# Patient Record
Sex: Male | Born: 1960 | Race: White | Hispanic: No | Marital: Married | Smoking: Former smoker
Health system: Southern US, Community
[De-identification: ages and names within clinical notes are randomized; demographics above are authoritative.]

## PROBLEM LIST (undated history)

## (undated) DIAGNOSIS — E785 Hyperlipidemia, unspecified: Secondary | ICD-10-CM

## (undated) DIAGNOSIS — I252 Old myocardial infarction: Secondary | ICD-10-CM

## (undated) DIAGNOSIS — I2510 Atherosclerotic heart disease of native coronary artery without angina pectoris: Secondary | ICD-10-CM

## (undated) DIAGNOSIS — I219 Acute myocardial infarction, unspecified: Secondary | ICD-10-CM

## (undated) DIAGNOSIS — M1990 Unspecified osteoarthritis, unspecified site: Secondary | ICD-10-CM

## (undated) DIAGNOSIS — I10 Essential (primary) hypertension: Secondary | ICD-10-CM

## (undated) HISTORY — DX: Old myocardial infarction: I25.2

## (undated) HISTORY — PX: SHOULDER ARTHROSCOPY: SHX128

## (undated) HISTORY — DX: Acute myocardial infarction, unspecified: I21.9

## (undated) HISTORY — PX: OTHER SURGICAL HISTORY: SHX169

## (undated) HISTORY — DX: Hyperlipidemia, unspecified: E78.5

## (undated) HISTORY — PX: ELBOW SURGERY: SHX618

## (undated) HISTORY — DX: Essential (primary) hypertension: I10

---

## 1999-12-24 ENCOUNTER — Emergency Department (HOSPITAL_COMMUNITY): Admission: EM | Admit: 1999-12-24 | Discharge: 1999-12-24 | Payer: Self-pay | Admitting: Emergency Medicine

## 2000-05-21 ENCOUNTER — Encounter: Admission: RE | Admit: 2000-05-21 | Discharge: 2000-05-21 | Payer: Self-pay | Admitting: Occupational Medicine

## 2000-05-21 ENCOUNTER — Encounter: Payer: Self-pay | Admitting: Occupational Medicine

## 2000-06-03 ENCOUNTER — Encounter: Payer: Self-pay | Admitting: Occupational Medicine

## 2000-06-03 ENCOUNTER — Encounter: Admission: RE | Admit: 2000-06-03 | Discharge: 2000-06-03 | Payer: Self-pay | Admitting: Occupational Medicine

## 2002-06-24 ENCOUNTER — Encounter: Payer: Self-pay | Admitting: Family Medicine

## 2002-06-24 ENCOUNTER — Encounter: Admission: RE | Admit: 2002-06-24 | Discharge: 2002-06-24 | Payer: Self-pay | Admitting: Family Medicine

## 2002-09-20 ENCOUNTER — Ambulatory Visit (HOSPITAL_BASED_OUTPATIENT_CLINIC_OR_DEPARTMENT_OTHER): Admission: RE | Admit: 2002-09-20 | Discharge: 2002-09-20 | Payer: Self-pay | Admitting: Orthopedic Surgery

## 2002-09-20 ENCOUNTER — Encounter (INDEPENDENT_AMBULATORY_CARE_PROVIDER_SITE_OTHER): Payer: Self-pay | Admitting: Specialist

## 2003-08-16 ENCOUNTER — Emergency Department (HOSPITAL_COMMUNITY): Admission: EM | Admit: 2003-08-16 | Discharge: 2003-08-16 | Payer: Self-pay | Admitting: Emergency Medicine

## 2003-12-20 ENCOUNTER — Ambulatory Visit (HOSPITAL_COMMUNITY): Admission: RE | Admit: 2003-12-20 | Discharge: 2003-12-20 | Payer: Self-pay | Admitting: Unknown Physician Specialty

## 2003-12-28 ENCOUNTER — Ambulatory Visit (HOSPITAL_BASED_OUTPATIENT_CLINIC_OR_DEPARTMENT_OTHER): Admission: RE | Admit: 2003-12-28 | Discharge: 2003-12-28 | Payer: Self-pay | Admitting: Orthopedic Surgery

## 2003-12-28 ENCOUNTER — Ambulatory Visit (HOSPITAL_COMMUNITY): Admission: RE | Admit: 2003-12-28 | Discharge: 2003-12-28 | Payer: Self-pay | Admitting: Orthopedic Surgery

## 2004-01-10 ENCOUNTER — Encounter (HOSPITAL_COMMUNITY): Admission: RE | Admit: 2004-01-10 | Discharge: 2004-02-09 | Payer: Self-pay | Admitting: Orthopedic Surgery

## 2004-02-12 ENCOUNTER — Encounter (HOSPITAL_COMMUNITY): Admission: RE | Admit: 2004-02-12 | Discharge: 2004-03-13 | Payer: Self-pay | Admitting: Orthopedic Surgery

## 2004-03-15 ENCOUNTER — Encounter (HOSPITAL_COMMUNITY): Admission: RE | Admit: 2004-03-15 | Discharge: 2004-04-14 | Payer: Self-pay | Admitting: Orthopedic Surgery

## 2004-04-23 ENCOUNTER — Encounter (HOSPITAL_COMMUNITY): Admission: RE | Admit: 2004-04-23 | Discharge: 2004-05-23 | Payer: Self-pay | Admitting: Orthopedic Surgery

## 2005-01-31 ENCOUNTER — Ambulatory Visit (HOSPITAL_COMMUNITY): Admission: RE | Admit: 2005-01-31 | Discharge: 2005-01-31 | Payer: Self-pay | Admitting: *Deleted

## 2006-04-19 ENCOUNTER — Inpatient Hospital Stay (HOSPITAL_COMMUNITY): Admission: EM | Admit: 2006-04-19 | Discharge: 2006-04-22 | Payer: Self-pay | Admitting: Emergency Medicine

## 2006-04-19 ENCOUNTER — Ambulatory Visit: Payer: Self-pay | Admitting: *Deleted

## 2006-04-30 ENCOUNTER — Encounter: Payer: Self-pay | Admitting: Cardiology

## 2006-04-30 ENCOUNTER — Ambulatory Visit: Payer: Self-pay

## 2006-04-30 ENCOUNTER — Ambulatory Visit: Payer: Self-pay | Admitting: Cardiology

## 2006-05-07 ENCOUNTER — Encounter (HOSPITAL_COMMUNITY): Admission: RE | Admit: 2006-05-07 | Discharge: 2006-08-05 | Payer: Self-pay | Admitting: Cardiovascular Disease

## 2006-05-26 ENCOUNTER — Ambulatory Visit: Payer: Self-pay | Admitting: Cardiology

## 2006-07-01 ENCOUNTER — Ambulatory Visit: Payer: Self-pay | Admitting: Cardiology

## 2006-08-06 ENCOUNTER — Encounter (HOSPITAL_COMMUNITY): Admission: RE | Admit: 2006-08-06 | Discharge: 2006-09-04 | Payer: Self-pay | Admitting: Cardiovascular Disease

## 2006-08-19 ENCOUNTER — Ambulatory Visit: Payer: Self-pay | Admitting: Cardiology

## 2006-08-26 ENCOUNTER — Ambulatory Visit: Payer: Self-pay | Admitting: Cardiology

## 2006-10-23 ENCOUNTER — Ambulatory Visit: Payer: Self-pay | Admitting: Cardiology

## 2006-10-23 LAB — CONVERTED CEMR LAB: ALT: 35 units/L (ref 0–40)

## 2006-10-27 ENCOUNTER — Ambulatory Visit: Payer: Self-pay | Admitting: Cardiology

## 2006-11-11 ENCOUNTER — Ambulatory Visit: Payer: Self-pay

## 2007-01-18 ENCOUNTER — Ambulatory Visit: Payer: Self-pay | Admitting: Cardiology

## 2007-01-18 ENCOUNTER — Ambulatory Visit: Payer: Self-pay

## 2007-01-18 LAB — CONVERTED CEMR LAB
Alkaline Phosphatase: 62 units/L (ref 39–117)
HDL: 32.1 mg/dL — ABNORMAL LOW (ref >39.0)
LDL Cholesterol: 70 mg/dL (ref 0–99)
Total Bilirubin: 0.7 mg/dL (ref 0.3–1.2)
Total Protein: 6.7 g/dL (ref 6.0–8.3)

## 2007-03-30 HISTORY — PX: CARDIAC CATHETERIZATION: SHX172

## 2007-04-06 ENCOUNTER — Ambulatory Visit: Payer: Self-pay | Admitting: Cardiology

## 2007-05-12 ENCOUNTER — Ambulatory Visit: Payer: Self-pay | Admitting: Cardiology

## 2007-05-12 ENCOUNTER — Inpatient Hospital Stay (HOSPITAL_COMMUNITY): Admission: EM | Admit: 2007-05-12 | Discharge: 2007-05-13 | Payer: Self-pay | Admitting: *Deleted

## 2007-06-10 ENCOUNTER — Ambulatory Visit: Payer: Self-pay | Admitting: Cardiology

## 2007-09-06 ENCOUNTER — Ambulatory Visit: Payer: Self-pay | Admitting: Cardiology

## 2007-09-30 HISTORY — PX: NECK SURGERY: SHX720

## 2008-04-03 ENCOUNTER — Ambulatory Visit: Payer: Self-pay | Admitting: Cardiology

## 2008-04-13 ENCOUNTER — Ambulatory Visit: Payer: Self-pay | Admitting: Cardiology

## 2008-04-13 LAB — CONVERTED CEMR LAB
ALT: 47 units/L (ref 0–53)
AST: 34 units/L (ref 0–37)
Albumin: 3.8 g/dL (ref 3.5–5.2)
Alkaline Phosphatase: 70 units/L (ref 39–117)
Bilirubin, Direct: 0.1 mg/dL (ref 0.0–0.3)
HDL: 27.8 mg/dL — ABNORMAL LOW (ref >39.0)
LDL Cholesterol: 79 mg/dL (ref 0–99)
Total CHOL/HDL Ratio: 4.8
Triglycerides: 135 mg/dL (ref 0–149)

## 2008-08-11 ENCOUNTER — Emergency Department (HOSPITAL_COMMUNITY): Admission: EM | Admit: 2008-08-11 | Discharge: 2008-08-11 | Payer: Self-pay | Admitting: Emergency Medicine

## 2009-01-19 DIAGNOSIS — I2510 Atherosclerotic heart disease of native coronary artery without angina pectoris: Secondary | ICD-10-CM

## 2009-01-19 DIAGNOSIS — Z8679 Personal history of other diseases of the circulatory system: Secondary | ICD-10-CM | POA: Insufficient documentation

## 2009-01-19 DIAGNOSIS — E7800 Pure hypercholesterolemia, unspecified: Secondary | ICD-10-CM

## 2009-01-24 ENCOUNTER — Encounter: Payer: Self-pay | Admitting: Cardiology

## 2009-02-06 ENCOUNTER — Ambulatory Visit: Payer: Self-pay | Admitting: Cardiology

## 2009-02-06 DIAGNOSIS — F17200 Nicotine dependence, unspecified, uncomplicated: Secondary | ICD-10-CM

## 2009-02-27 ENCOUNTER — Telehealth: Payer: Self-pay | Admitting: Cardiology

## 2009-03-01 ENCOUNTER — Ambulatory Visit (HOSPITAL_COMMUNITY): Admission: RE | Admit: 2009-03-01 | Discharge: 2009-03-02 | Payer: Self-pay | Admitting: Neurosurgery

## 2009-03-22 ENCOUNTER — Encounter: Payer: Self-pay | Admitting: Cardiology

## 2009-04-17 ENCOUNTER — Ambulatory Visit: Payer: Self-pay | Admitting: Cardiology

## 2009-04-18 ENCOUNTER — Telehealth: Payer: Self-pay | Admitting: Cardiology

## 2009-04-26 LAB — CONVERTED CEMR LAB
Albumin: 4.1 g/dL (ref 3.5–5.2)
Alkaline Phosphatase: 67 units/L (ref 39–117)
Bilirubin, Direct: 0 mg/dL (ref 0.0–0.3)
HDL: 36.5 mg/dL — ABNORMAL LOW (ref >39.00)
LDL Cholesterol: 73 mg/dL (ref 0–99)
Total Bilirubin: 1.1 mg/dL (ref 0.3–1.2)
Total CHOL/HDL Ratio: 4
Total Protein: 6.5 g/dL (ref 6.0–8.3)

## 2009-04-27 ENCOUNTER — Ambulatory Visit: Payer: Self-pay | Admitting: Cardiology

## 2009-04-27 ENCOUNTER — Telehealth: Payer: Self-pay | Admitting: Cardiology

## 2009-05-01 LAB — CONVERTED CEMR LAB
Albumin: 4.1 g/dL (ref 3.5–5.2)
Alkaline Phosphatase: 62 units/L (ref 39–117)
Bilirubin, Direct: 0.2 mg/dL (ref 0.0–0.3)
Total Bilirubin: 1 mg/dL (ref 0.3–1.2)
Total Protein: 6.6 g/dL (ref 6.0–8.3)

## 2009-05-07 ENCOUNTER — Telehealth: Payer: Self-pay | Admitting: Cardiology

## 2009-05-10 ENCOUNTER — Ambulatory Visit: Payer: Self-pay | Admitting: Cardiology

## 2009-05-15 LAB — CONVERTED CEMR LAB
AST: 21 units/L (ref 0–37)
Alkaline Phosphatase: 67 units/L (ref 39–117)
Total Bilirubin: 0.9 mg/dL (ref 0.3–1.2)

## 2009-05-22 ENCOUNTER — Encounter: Payer: Self-pay | Admitting: Cardiology

## 2009-05-24 ENCOUNTER — Encounter: Payer: Self-pay | Admitting: Cardiology

## 2009-06-11 ENCOUNTER — Ambulatory Visit: Payer: Self-pay | Admitting: Cardiology

## 2009-08-02 ENCOUNTER — Telehealth (INDEPENDENT_AMBULATORY_CARE_PROVIDER_SITE_OTHER): Payer: Self-pay | Admitting: *Deleted

## 2009-08-14 ENCOUNTER — Encounter: Payer: Self-pay | Admitting: Cardiology

## 2009-09-10 ENCOUNTER — Ambulatory Visit: Payer: Self-pay | Admitting: Cardiology

## 2009-09-10 LAB — CONVERTED CEMR LAB
Bilirubin, Direct: 0 mg/dL (ref 0.0–0.3)
Direct LDL: 141.1 mg/dL
Total Bilirubin: 0.9 mg/dL (ref 0.3–1.2)
Total Protein: 7.1 g/dL (ref 6.0–8.3)

## 2009-09-11 ENCOUNTER — Telehealth: Payer: Self-pay | Admitting: Cardiology

## 2009-10-30 ENCOUNTER — Ambulatory Visit: Payer: Self-pay | Admitting: Cardiology

## 2009-10-31 ENCOUNTER — Telehealth: Payer: Self-pay | Admitting: Cardiology

## 2009-11-12 LAB — CONVERTED CEMR LAB
ALT: 26 units/L (ref 0–53)
Albumin: 4.3 g/dL (ref 3.5–5.2)
Alkaline Phosphatase: 58 units/L (ref 39–117)
Bilirubin, Direct: 0.1 mg/dL (ref 0.0–0.3)
HDL: 39.6 mg/dL (ref >39.00)
VLDL: 18.8 mg/dL (ref 0.0–40.0)

## 2009-12-10 ENCOUNTER — Ambulatory Visit: Payer: Self-pay | Admitting: Cardiology

## 2009-12-13 ENCOUNTER — Telehealth (INDEPENDENT_AMBULATORY_CARE_PROVIDER_SITE_OTHER): Payer: Self-pay | Admitting: *Deleted

## 2010-01-01 ENCOUNTER — Encounter: Payer: Self-pay | Admitting: Cardiology

## 2010-03-22 ENCOUNTER — Encounter: Payer: Self-pay | Admitting: Cardiology

## 2010-04-03 ENCOUNTER — Ambulatory Visit: Payer: Self-pay | Admitting: Cardiology

## 2010-04-08 ENCOUNTER — Encounter: Admission: RE | Admit: 2010-04-08 | Discharge: 2010-06-27 | Payer: Self-pay | Admitting: Orthopedic Surgery

## 2010-04-08 LAB — CONVERTED CEMR LAB
ALT: 29 units/L (ref 0–53)
AST: 24 units/L (ref 0–37)
Alkaline Phosphatase: 62 units/L (ref 39–117)
Bilirubin, Direct: 0.1 mg/dL (ref 0.0–0.3)
LDL Cholesterol: 73 mg/dL (ref 0–99)
Total Bilirubin: 0.6 mg/dL (ref 0.3–1.2)
VLDL: 31.8 mg/dL (ref 0.0–40.0)

## 2010-06-10 ENCOUNTER — Ambulatory Visit: Payer: Self-pay | Admitting: Cardiology

## 2010-07-01 ENCOUNTER — Encounter: Payer: Self-pay | Admitting: Cardiology

## 2010-10-31 NOTE — Letter (Signed)
Summary: [facility] Office Note  [facility] Office Note   Imported By: Roderic Ovens 11/23/2009 15:12:39  _____________________________________________________________________  External Attachment:    Type:   Image     Comment:   External Document

## 2010-10-31 NOTE — Miscellaneous (Signed)
Summary: Orders Update  Clinical Lists Changes  Orders: Added new Test order of TLB-Lipid Panel (80061-LIPID) - Signed Added new Test order of TLB-Hepatic/Liver Function Pnl (80076-HEPATIC) - Signed 

## 2010-10-31 NOTE — Progress Notes (Signed)
Summary: [facility]  Phone Note Refill Request Call back at Home Phone [phone] Message from:  Patient on December 13, 2009 9:55 AM  Refills Requested: Medication #1:  TOPROL XL 25 MG XR24H-TAB Take 1 tablet by mouth once a day  Medication #2:  CRESTOR 10 MG TABS Take one tablet by mouth daily. [facility] [id number].    Method Requested: Fax to Mail Away Pharmacy Initial call taken by: Lorne Skeens,  December 13, 2009 9:55 AM  Follow-up for Phone Call        Rx faxed to pharmacy Follow-up by: Vikki Ports,  December 13, 2009 1:42 PM    Prescriptions: CRESTOR 10 MG TABS (ROSUVASTATIN CALCIUM) Take one tablet by mouth daily.  #90 x 3   Entered by:   Vikki Ports   Authorized by:   Ronaldo Miyamoto, MD, [facility]   Signed by:   Vikki Ports on 12/13/2009   Method used:   Faxed to ...       [facility] Fax) (mail-order)             ,          Ph: [phone]       Fax: [phone]   RxID:   [id number] TOPROL XL 25 MG XR24H-TAB (METOPROLOL SUCCINATE) Take 1 tablet by mouth once a day  #90 x 3   Entered by:   Vikki Ports   Authorized by:   Ronaldo Miyamoto, MD, [facility]   Signed by:   Vikki Ports on 12/13/2009   Method used:   Faxed to ...       [facility] Delivery Fax) (mail-order)             ,          Ph: [phone]       Fax: [phone]   RxID:   [id number]

## 2010-10-31 NOTE — Progress Notes (Signed)
Summary: test results  Phone Note Call from Patient Call back at Home Phone [phone]   Caller: Patient Reason for Call: Talk to Nurse, Lab or Test Results Initial call taken by: Lorne Skeens,  October 31, 2009 4:19 PM  Follow-up for Phone Call        Pt. calling regarding lipid and liver results.  Pt. given results and I told him that Dr. Riley Kill had not reviewed them yet and we would call him if any changes need to be made after MD reviews. Follow-up by: Dossie Arbour, RN, BSN,  October 31, 2009 5:11 PM

## 2010-10-31 NOTE — Letter (Signed)
Summary: [facility]   [facility]   Imported By: Roderic Ovens 04/12/2010 10:52:14  _____________________________________________________________________  External Attachment:    Type:   Image     Comment:   External Document

## 2010-10-31 NOTE — Letter (Signed)
Summary: [facility]  [facility]   Imported By: Roderic Ovens 03/20/2010 15:05:04  _____________________________________________________________________  External Attachment:    Type:   Image     Comment:   External Document

## 2010-10-31 NOTE — Assessment & Plan Note (Signed)
Summary: f65m   Visit Type:  6 mo f/u Primary Provider:  Dr. Wilford Sports  CC:  chest pain about 1 week ago....pt c/o pain his left knee...denies any edema....sob....  History of Present Illness: Patient feels good.  Taking good care of himself.  Crestor not bothering him.  Tolerating everything well.  Had some chest pain about thtree weeks ago, but it went away and he has not had any recurrence despite being somewhat active.  Had elbow surgery, so out of work now for ten weeks.    Current Medications (verified): 1)  Toprol Xl 25 Mg Xr24h-Tab (Metoprolol Succinate) .... Take 1 Tablet By Mouth Once A Day 2)  Aspirin 81 Mg Tbec (Aspirin) .... Take One Tablet By Mouth Daily 3)  Nitroglycerin 0.4 Mg Subl (Nitroglycerin) .... One Tablet Under Tongue Every 5 Minutes As Needed For Chest Pain---May Repeat Times Three 4)  Fish Oil 1200 Mg Caps (Omega-3 Fatty Acids) .... 3 Caps At Dinner Time 5)  Crestor 10 Mg Tabs (Rosuvastatin Calcium) .... Take One Tablet By Mouth Daily. 6)  Calcium 600 Mg Tabs (Calcium) .... Once Daily  Allergies (verified): No Known Drug Allergies  Past History:  Past Medical History: Last updated: Feb 14, 2009   CAD (ICD-414.00)- previous percutaneous intervention of the right coronary artery with a non drug-eluting platform, and 60% to 70% stenosis of the circumflex coronary. HYPERCHOLESTEROLEMIA (ICD-272.0) PALPITATIONS, HX OF (ICD-V12.50)  Past Surgical History: Last updated: 02/14/2009  bare metal stenting to the proximal right coronary artery. left elbow surgery. left shoulder rotator cuff repair  Family History: Last updated: 02-14-09  Both parents are deceased, mother at 55 with a probable   myocardial infarction and hypertension, father at 23 with a   postoperative AAA, history of heavy tobacco use.      Social History: Last updated: 14-Feb-2009  He resides with his wife.  He has a 69-year-old   daughter.  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   He   continues to smoke but he is down to 2 cigarettes per day.  Prior to   that he was smoking at least 1 pack per day for 20 years.  Denies any   alcohol, drugs, exercise and maintains a low-fat diet.   Vital Signs:  Patient profile:   50 year old male Height:      67 inches Weight:      170.12 pounds BMI:     26.74 Pulse rate:   81 / minute Pulse rhythm:   irregular BP sitting:   126 / 80  (left arm) Cuff size:   large  Vitals Entered By: Danielle Rankin, CMA (June 10, 2010 12:18 PM)  Physical Exam  General:  Well developed, well nourished, in no acute distress. Head:  normocephalic and atraumatic Eyes:  PERRLA/EOM intact; conjunctiva and lids normal. Ears:  TM's intact and clear with normal canals and hearing Lungs:  Clear bilaterally to auscultation and percussion. Heart:  Pmi non displaced.  Normal S1 and S2. Abdomen:  Bowel sounds positive; abdomen soft and non-tender without masses, organomegaly, or hernias noted. No hepatosplenomegaly. Extremities:  No clubbing or cyanosis. Neurologic:  Alert and oriented x 3.   EKG  Procedure date:  06/10/2010  Findings:      NSR.  Nonspecific ST and T abnormality.  Impression & Recommendations:  Problem # 1:  CAD (ICD-414.00) Appears to be stable at the present time.  No significant, persistent symptoms.  Reasonably active. His updated medication list for this problem  includes:    Toprol Xl 25 Mg Xr24h-tab (Metoprolol succinate) .Marland Kitchen... Take 1 tablet by mouth once a day    Aspirin 81 Mg Tbec (Aspirin) .Marland Kitchen... Take one tablet by mouth daily    Nitroglycerin 0.4 Mg Subl (Nitroglycerin) ..... One tablet under tongue every 5 minutes as needed for chest pain---may repeat times three  Problem # 2:  HYPERCHOLESTEROLEMIA (ICD-272.0) LDL is near target.  HDL is quite low, but debate about benefit of raising.  Continue to monitor for now.  His updated medication list for this problem includes:    Crestor 10 Mg Tabs (Rosuvastatin  calcium) .Marland Kitchen... Take one tablet by mouth daily.  Problem # 3:  CIGARETTE SMOKER (ICD-305.1) Has quit.   Patient Instructions: 1)  Your physician recommends that you schedule a follow-up appointment in: 6 months with Dr. Riley Kill 2)  Your physician recommends that you return for a FASTING lipid profile and liver in 6 months  272.0, 414.01

## 2010-10-31 NOTE — Assessment & Plan Note (Signed)
Summary: f51m   History of Present Illness: Overall doing well.  Not having any chest pain.  Taking good care of himself.  Wife doing a better job with his diet.   Current Medications (verified): 1)  Toprol Xl 25 Mg Xr24h-Tab (Metoprolol Succinate) .... Take 1 Tablet By Mouth Once A Day 2)  Aspirin 81 Mg Tbec (Aspirin) .... Take One Tablet By Mouth Daily 3)  Nitroglycerin 0.4 Mg Subl (Nitroglycerin) .... One Tablet Under Tongue Every 5 Minutes As Needed For Chest Pain---May Repeat Times Three 4)  Fish Oil 1200 Mg Caps (Omega-3 Fatty Acids) .... Take One Capsule Three Times A Day 5)  Crestor 10 Mg Tabs (Rosuvastatin Calcium) .... Take One Tablet By Mouth Daily. 6)  Calcium 600 Mg Tabs (Calcium) .... Once Daily  Allergies (verified): No Known Drug Allergies  Vital Signs:  Patient profile:   50 year old male Height:      67 inches Weight:      163 pounds BMI:     25.62 Pulse rate:   80 / minute Pulse rhythm:   irregular BP sitting:   129 / 85  (right arm) Cuff size:   large  Vitals Entered By: Judithe Modest CMA (December 10, 2009 4:49 PM)  Physical Exam  General:  Well developed, well nourished, in no acute distress. Head:  normocephalic and atraumatic Eyes:  PERRLA/EOM intact; conjunctiva and lids normal. Neck:  No carotid bruits.   Chest Wall:  no deformities or breast masses noted Lungs:  Clear bilaterally to auscultation and percussion. Heart:  Non-displaced PMI, chest non-tender; regular rate and rhythm, S1, S2 without murmurs, rubs or gallops. Carotid upstroke normal, no bruit. Normal abdominal aortic size, no bruits. Extremities:  No clubbing or cyanosis. Neurologic:  Alert and oriented x 3.   EKG  Procedure date:  12/10/2009  Findings:      NSR.  Nonspecific ST abnormality.  Impression & Recommendations:  Problem # 1:  CAD (ICD-414.00) Doing well overall.  No chest pain.   His updated medication list for this problem includes:    Toprol Xl 25 Mg Xr24h-tab  (Metoprolol succinate) .Marland Kitchen... Take 1 tablet by mouth once a day    Aspirin 81 Mg Tbec (Aspirin) .Marland Kitchen... Take one tablet by mouth daily    Nitroglycerin 0.4 Mg Subl (Nitroglycerin) ..... One tablet under tongue every 5 minutes as needed for chest pain---may repeat times three  Orders: EKG w/ Interpretation (93000)  Problem # 2:  HYPERCHOLESTEROLEMIA (ICD-272.0) Stable.  At target.   His updated medication list for this problem includes:    Crestor 10 Mg Tabs (Rosuvastatin calcium) .Marland Kitchen... Take one tablet by mouth daily.  Problem # 3:  CIGARETTE SMOKER (ICD-305.1) None whatsoever  Patient Instructions: 1)  Your physician recommends that you return for a FASTING LIPID and LIVER Profile in June (414.01, 272.0). 2)  Your physician recommends that you continue on your current medications as directed. Please refer to the Current Medication list given to you today. 3)  Your physician wants you to follow-up in:  6 MONTHS.  You will receive a reminder letter in the mail two months in advance. If you don't receive a letter, please call our office to schedule the follow-up appointment. Prescriptions: CRESTOR 10 MG TABS (ROSUVASTATIN CALCIUM) Take one tablet by mouth daily.  #90 x 3   Entered by:   Judithe Modest CMA   Authorized by:   Ronaldo Miyamoto, MD, [facility]   Signed by:   Judithe Modest  CMA on 12/10/2009   Method used:   Faxed to ...       [facility] Delivery Fax) (mail-order)             ,          Ph: [phone]       Fax: [phone]   RxID:   [id number] TOPROL XL 25 MG XR24H-TAB (METOPROLOL SUCCINATE) Take 1 tablet by mouth once a day  #90 x 3   Entered by:   Judithe Modest CMA   Authorized by:   Ronaldo Miyamoto, MD, [facility]   Signed by:   Judithe Modest CMA on 12/10/2009   Method used:   Faxed to ...       [facility] Delivery Fax) (mail-order)             ,          Ph: [phone]       Fax: [phone]   RxID:   [id number]   Appended  Document: f63m Patient needs elbow surgery.  Prior history of non DES.  Doing well.  No longer smoking, and no significant angina, with good met level of activity.  Surgery is low risk type of procedure.  Cath within five years.  He should be able to tolerate surgery, and withhold ASA for five days.  Has 60% CFX which was stable, and no current unstable symptoms.  Ermalene Postin, MD, [facility], FSCAI

## 2010-11-20 NOTE — Letter (Signed)
Summary: [facility]  [facility]   Imported By: Marylou Mccoy 11/15/2010 10:35:05  _____________________________________________________________________  External Attachment:    Type:   Image     Comment:   External Document

## 2011-01-06 LAB — CBC
HCT: 43 % (ref 39.0–52.0)
Hemoglobin: 15 g/dL (ref 13.0–17.0)
MCHC: 35 g/dL (ref 30.0–36.0)
MCV: 92.2 fL (ref 78.0–100.0)
Platelets: 184 10*3/uL (ref 150–400)
RBC: 4.67 MIL/uL (ref 4.22–5.81)
RDW: 13.5 % (ref 11.5–15.5)
WBC: 5.3 10*3/uL (ref 4.0–10.5)

## 2011-01-06 LAB — BASIC METABOLIC PANEL
BUN: 11 mg/dL (ref 6–23)
CO2: 27 mEq/L (ref 19–32)
Calcium: 9.2 mg/dL (ref 8.4–10.5)
Chloride: 107 mEq/L (ref 96–112)
Creatinine, Ser: 0.97 mg/dL (ref 0.4–1.5)
GFR calc Af Amer: >60 mL/min (ref >60)
GFR calc non Af Amer: >60 mL/min (ref >60)
Glucose, Bld: 122 mg/dL — ABNORMAL HIGH (ref 70–99)
Potassium: 3.9 mEq/L (ref 3.5–5.1)
Sodium: 141 mEq/L (ref 135–145)

## 2011-02-11 NOTE — Cardiovascular Report (Signed)
[name], [name] NO.:  [id number]   MEDICAL RECORD NO.:  [id number]          PATIENT TYPE:  INP   LOCATION:  3729                         FACILITY:  [facility]   PHYSICIAN:  [name]. Riley Kill, MD, FACCDATE OF BIRTH:  [birth year]   DATE OF PROCEDURE:  05/13/2007  DATE OF DISCHARGE:                            CARDIAC CATHETERIZATION   INDICATIONS:  Mr. Cristobal is a 50 year old gentleman who previously  underwent stenting of the right coronary artery in the setting of acute  myocardial infarction.  He had residual 60% disease of the circumflex.  He presented yesterday with chest pain.  He took nitroglycerin with  relief,  the pain was somewhat atypical and his enzymes were negative  and there were no electrocardiographic abnormalities.  However, with a  residual high-grade disease and the prior moderate sized inferior  infarction, was felt that repeat cardiac catheterization would be  warranted, specifically in light of his prior infarction,  risks,  benefits and alternatives were discussed including the possibility of  doing an outpatient radionuclide imaging study.  The patient was  agreeable to proceed.   PROCEDURES:  1. Left heart catheterization  2. Selective coronary territory.  3. Selective left ventriculography.   DESCRIPTION OF PROCEDURE:  The patient was brought to the  catheterization laboratory and prepped and draped in usual fashion.  Through an anterior puncture, the femoral artery was easily entered.  A  5-French sheath was then placed.  Views of the left and right coronaries  were obtained in multiple angiographic projections.  Central aortic and  left ventricular pressures were measured with pigtail.  Ventriculography  was performed in the RAO projection.  The patient tolerated procedure  well and was without complication.  He was taken to the holding area in  satisfactory clinical condition.  I then gave his wife copies of the  imaging studies.   There were no complications.  The patient approved of  the delivery of the pictures to the patient's spouse.   HEMODYNAMIC DATA:  1. Central aortic pressure 112/78, mean 94.  2. Left ventricular pressure 111/14.  3. No gradient on pullback across aortic valve.   ANGIOGRAPHIC DATA:  1. The right coronary artery was the previous infarct artery.  The      right coronary artery is stented.  The stent is a long area of      stenting in the midvessel.  At its worst spot, there is probably      about 40% narrowing.  It is a well-healed site with smooth margins.      The more proximal and distal portions of the stent are widely      patent.  There is no evidence of significant high-grade in-stent      restenosis.  The distal vessel consists predominantly of a      posterior descending artery and a posterolateral artery, both of      which are of fairly moderate size in caliber and without critical      narrowing.  2. The left main coronary artery is free of critical disease.  3. The left anterior descending artery courses to the apex.  There is      a proximal septal and diagonal, both of which are just small in      size.  There is also a second diagonal which is a bifurcating      vessel, also relatively small in size.  There is minor luminal      irregularity of the LAD and the LAD wraps the apical tip and      provides the distal portion of the inferior wall.  There does not      appear to be high-grade stenosis.  4. The circumflex coronary artery proximally, leading into the AV      circumflex, is free of critical disease.  The very large marginal      branch has a previous stenosis of about 60%.  At present, this      appears to be about 50%, and relatively smooth.  It does not have      the appearance of an unstable plaque.  The distal vessel consists      of a marginal branch which is widely patent.  5. Ventriculography in the RAO projection reveals preserved global      systolic  function.  Ejection fraction estimate would be around 50%.      The inferior myocardial segment is modestly hypokinetic, but still      does move in the appropriate direction.  It is, however, not      completely normalized.  There does not appear to be significant      mitral regurgitation.   CONCLUSIONS:  1. Preserved overall left ventricular function with an inferior wall      motion abnormality characterized by mild hypokinesis.  2. Continued patency of the infarct-related artery with 30-40%      narrowing in the midvessel at the stent site, but not high-grade      stenosis.  3. Residual 50% lesion in the mid circumflex marginal, somewhat      smoother than on the previous study, and without unstable features.  4. Incidental angiographic evidence of prolapsing mitral valve without      significant angiographic mitral regurgitation.   MEDICATIONS:  1. The patient will continue aspirin and,  2. Plavix for the present time.   RECOMMENDATIONS:  1. I am going to strongly encourage him to stop smoking, and if he      stops smoking, we will probably change him to a single aspirin a      day.  2. Continued follow-up with cardiology clinic.  3. Continued lipid reduction.      [name]. Riley Kill, MD, [facility]  Electronically Signed     TDS/MEDQ  D:  05/13/2007  T:  05/14/2007  Job:  [id number]   cc:   Bryan Lemma. Manus Gunning, M.D.

## 2011-02-11 NOTE — Discharge Summary (Signed)
NAMEJAIDYN, [name] NO.:  [id number]   MEDICAL RECORD NO.:  [id number]          PATIENT TYPE:  INP   LOCATION:  3729                         FACILITY:  [facility]   PHYSICIAN:  [name], ANP DATE OF BIRTH:  11/17/1960   DATE OF ADMISSION:  05/12/2007  DATE OF DISCHARGE:  05/13/2007                               DISCHARGE SUMMARY   PRIMARY CARDIOLOGIST:  Dr. Shawnie Ochoa.   PRIMARY CARE PHYSICIAN:  Dr. Manus Ochoa with [facility].   DISCHARGE DIAGNOSIS:  Chest pain.   SECONDARY DIAGNOSIS:  1. Coronary artery disease status post inferior myocardial infarction,      July 2007, with bare metal stenting to the proximal right coronary      artery.  2. Mitral valve prolapse.  3. Hyperlipidemia.  4. Degenerative joint disease.  5. Ongoing tobacco abuse.  6. Status post left elbow surgery.   ALLERGIES:  HIGH-DOSE LIPITOR causes elevations of LFTs.   PROCEDURES:  Left heart cardiac catheterization.   HISTORY OF PRESENT ILLNESS:  A 50 year old male with a prior history of  CAD, status post MI and stenting in July 2007, who was is in his usual  state of health until May 12, 2007 when he developed left anterior  sharp-knife like chest pain with diaphoresis and pallor.  He was at work  when this occurred, and he took a sublingual nitroglycerin without  significant relief, and then was taken by his supervisor to the [facility] ED.  In the ED, ECG was without any acute changes, and his cardiac  markers were negative x1.  He was admitted for further evaluation.   HOSPITAL COURSE:  The patient underwent left heart cardiac  catheterization on August 14, revealing patency of the previously placed  right coronary artery stent, and otherwise nonobstructive disease.  His  EF was 50% with inferior hypokinesis.  Postcatheterization, the patient  was ambulating without recurrent discomfort under limitations, and will  be discharged home today in satisfactory  condition.   DISCHARGE LABORATORY DATA:  Hemoglobin 13.7, hematocrit 39.8, WBC 5.4,  platelets 155.  PT 12.9, INR 1.0, PTT 29.  Sodium 140, potassium 4.5,  chloride of 108, CO2 of 28, BUN 11, creatinine 0.94, glucose 113.  Total  bilirubin 0.8, alkaline phosphatase 58, AST 31, ALT 39, total protein  6.5, albumin 4.0, calcium 8.8, CK 99, MB 1.5, troponin-I 0.01.  Total  cholesterol 118, triglycerides 113, HDL 29, LDL 66.   DISPOSITION:  Patient being discharged home today in good condition.   FOLLOWUP PLANS AND APPOINTMENTS:  He is to follow up with Dr. Riley Ochoa on  August 28 at 4 p.m.   DISCHARGE MEDICATIONS:  1. Aspirin 325 mg daily.  2. Crestor 10 mg daily.  3. Toprol-XL 25 mg daily.  4. Chantix 1 mg b.i.d.  5. Omega-3 fatty acid 1000 mg daily.  6. [place] 2 tabs daily.  7. Nitroglycerin 0.4 mg sublingual p.r.n. chest pain.   OUTSTANDING LAB STUDIES:  None.   DURATION OF DISCHARGE ENCOUNTER:  35 minutes including physician time.      [name],  ANP     CB/MEDQ  D:  05/13/2007  T:  05/13/2007  Job:  [id number]

## 2011-02-11 NOTE — Assessment & Plan Note (Signed)
[facility]                            CARDIOLOGY OFFICE NOTE   PARKE, JANDREAU                        MRN:          [id number]  DATE:06/10/2007                            DOB:          19-Nov-1960    Mr. Plato is in for followup. He occasionally feels some palpitations and  very brief flutter. Importantly, he was recently admitted to the  hospital with some atypical chest pain. He underwent repeat cardiac  catheterization. The very large marginal had a previous stenosis of  about 60% and at this time is graded as 50%. RAO ventriculography  revealed well-preserved LV function with modest hypokinesis of the  inferior wall but EF of 50%. There was continued patency of the infarct-  related artery with 30-40% narrowing in the mid vessel at the stent site  but no evidence of high-grade stenosis.   Today, on examination, the blood pressure was 112/70, the pulse was 62.  The lung fields were clear.  The cardiac rhythm is regular.  Catheterization site has healed.   He continues to remain stable. His infarct-related artery is patent, and  his overall LV function is somewhat improved. His circumflex lesion is  better. His LDL cholesterol has been at target. Discontinuation of  smoking would be strongly recommended. The patient is now off of Plavix,  but should remain on Crestor and at least a single aspirin daily. This  has been reiterated to the patient.     [name]. Riley Kill, MD, [facility]  Electronically Signed    TDS/MedQ  DD: 09/06/2007  DT: 09/07/2007  Job #: [id number]
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
## 2011-02-11 NOTE — H&P (Signed)
NAMECHRISTOBAL, MORADO NO.:  [id number]   MEDICAL RECORD NO.:  [id number]          PATIENT TYPE:  INP   LOCATION:  3729                         FACILITY:  [facility]   PHYSICIAN:  [name]. Riley Kill, MD, FACCDATE OF BIRTH:  08-31-1961   DATE OF ADMISSION:  05/12/2007  DATE OF DISCHARGE:                              HISTORY & PHYSICAL   BRIEF HISTORY:  Mr. [name] is a 50 year old white male who presented to  [facility] emergency room complaining of chest discomfort.  He states  that when he woke up this morning he did not quite feel right, nothing  specific.  At work around between 6:00 and 6:30 a.m., he developed a  left anterior sharp knife-like pain in his chest and some diaphoresis in  his hands.  His coworkers stated that he looked very pale.  He took a  sublingual nitroglycerin with improvement.  He went to talk to a  supervisor and the supervisor said go to the emergency room, thus his  presentation.  He denies prior occurrences.  He states that this is not  similar to his myocardial infarction in 07/07.   PAST MEDICAL HISTORY:   ALLERGIES:  NO KNOWN DRUG ALLERGIES.  However, with high dose Lipitor he  does have associated elevated LFT's.   MEDICATIONS PRIOR TO ADMISSION:  Include Crestor 10 mg q.day, Lopressor  25 mg q.day, aspirin 325 q.day, Chantix 1 mg b.i.d., Omega-3 1000 mg  q.day.  Plavix was recently discontinued within the last month.   He has a history of dyslipidemia, DJD with left elbow surgery.  He has  known coronary artery disease with an inferior myocardial infarction in  07/07.  He has had stress test on 01/18/07 and 05/27/07 that have been  unremarkable.  Last catheterization was on 04/24/06.  He had a 60-70%  proximal circumflex, total proximal and mid RCA.  He received bare metal  stenting to the proximal RCA and spasm in the mid RCA was reduced with  intracoronary nitroglycerin.  EF was 50-55% inferior hypokinesis,  probable mitral valve  prolapse by catheterization.  However,  echocardiogram in 07/07 showed an EF of 55-60%, no wall motion  abnormalities, trace MR and TR and no evidence of mitral valve prolapse.   SOCIAL HISTORY:  He resides with his wife.  He has a 37-year-old  daughter.  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  He  continues to smoke but he is down to 2 cigarettes per day.  Prior to  that he was smoking at least 1 pack per day for 20 years.  Denies any  alcohol, drugs, exercise and maintains a low-fat diet.   FAMILY HISTORY:  Both parents are deceased, mother at 62 with a probable  myocardial infarction and hypertension, father at 88 with a  postoperative AAA, history of heavy tobacco use.   REVIEW OF SYSTEMS:  In addition to the above is notable for occasional  headaches.   PHYSICAL EXAM:  GENERAL:  Well-nourished, well-developed pleasant white  male in no apparent distress.  VITAL SIGNS:  Temperature 97.4, blood pressure  113/71, pulse 68 and  regular, respirations 24 and regular, 97% sats on room air.  HEENT: Unremarkable.  NECK:  Supple without thyromegaly, adenopathy, JVD or carotid bruits.  CHEST:  Symmetrical excursion.  Breath sounds are slightly diminished  but clear to auscultation.  HEART:  PMI is not displaced.  Regular rate  and rhythm.  Normal S1 and S2.  Did not appreciate any murmurs, rubs,  clicks or gallops.  All pulses are symmetrical and intact.  Did not  appreciate any abdominal or femoral bruits.  SKIN:  Integument intact.  ABDOMEN:  Bowel sounds present without organomegaly, masses or  tenderness.  EXTREMITIES:  Negative cyanosis, clubbing or edema.  MUSCULOSKELETAL/NEURO:  Unremarkable.   Chest x-Hank, the apices were excluded.  Chest x-Rosa is due to be  repeated.  EKG shows normal sinus rhythm, early repolarization, normal  intervals, no old EKG's for comparison.   H and H is 13.7 and 39.8, normal indices, platelets 155, WBC's 5.4.  Sodium 141, potassium 3.8,  BUN 11, creatinine 0.92, glucose 111.  Point  of care marker was negative x1.   IMPRESSION:  1. Somewhat atypical chest discomfort but continues to smoke.  Has      known coronary artery disease.  2.  Tobacco use.  2. Known coronary artery disease as described in last cardiac      catheterization.  History of hyperlipidemia.   DISPOSITION:  Dr. Riley Kill reviewed the patient's history, spoke with and  examined the patient and agrees with the above.  We will admit him  overnight to rule out myocardial infarction.  We will continue him on  his home medications.  We have arranged for cardiac catheterization  tomorrow for definitive evaluation of his RCA stent and known residual  disease.      Joellyn Rued, PA-C      [name]. Riley Kill, MD, [facility]  Electronically Signed    EW/MEDQ  D:  05/12/2007  T:  05/13/2007  Job:  [id number]

## 2011-02-11 NOTE — Op Note (Signed)
NAMEMARSHON, BANGS NO.:  [id number]   MEDICAL RECORD NO.:  [id number]          PATIENT TYPE:  OIB   LOCATION:  3528                         FACILITY:  [facility]   PHYSICIAN:  Hewitt Shorts, M.D.DATE OF BIRTH:  Jun 04, 1961   DATE OF PROCEDURE:  03/01/2009  DATE OF DISCHARGE:                               OPERATIVE REPORT   PREOPERATIVE DIAGNOSES:  1. Cervical herniated nucleus pulposus.  2. Cervical spondylosis.  3. Cervical degenerative disk disease.  4. Cervical radiculopathy.   POSTOPERATIVE DIAGNOSES:  1. Cervical herniated nucleus pulposus.  2. Cervical spondylosis.  3. Cervical degenerative disk disease.  4. Cervical radiculopathy.   PROCEDURE:  C5-6 and C6-7 anterior cervical decompression and  arthrodesis with allograft and Tether cervical plate.   SURGEON:  Hewitt Shorts, MD   ASSISTANTS:  1. Russell L. Webb Silversmith, RN  2. Danae Orleans. Venetia Maxon, MD   ANESTHESIA:  General endotracheal.   INDICATIONS:  The patient is a 50 year old man who presented with right  cervical radicular pain.  It was found x-Ytuarte and MRI scan to have  degeneration with C5-6 and C6-7 levels with associated spondylitic disk  herniation.  Decision was made to proceed with decompression and  arthrodesis.   PROCEDURE IN DETAIL:  The patient was brought to the operating room,  placed under general endotracheal anesthesia.  The patient was placed in  10 pounds of Halter traction.  Neck was prepped with Betadine soap and  solution and draped in a sterile fashion.  A horizontal incision was  made at the left side of the neck.  The line of the incision was  infiltrated with local anesthetic with epinephrine.  Incision was  carried down through the subcutaneous tissue and platysma.  Bipolar  cautery was used to maintain hemostasis.  Dissection was then carried  down through an avascular plane leaving the sternocleidomastoid, carotid  artery, and jugular vein laterally, trachea and  esophagus medially.  The  ventral aspect of the vertebral column was identified and localizing x-  [place] taken and the C5-6 and C6-7 intervertebral disk spaces identified.  Diskectomy was begun at each level with incision of the annulus  continuing with microcurette and pituitary rongeurs.  The operating  microscope was draped and brought into the field to provide  magnification, illumination, and visualization, and the remainder of the  decompression was performed using microdissection and microsurgical  technique.  The cauterized  endplates of the vertebral bodies removed  using microcurettes along with Stryker high-speed drill, and then  posterior osteophytic overgrowth was removed using the high-speed  Stryker drill and 2-mm Kerrison punches with thin foot plates.  Posterior longitudinal ligament, which was thickened, was carefully  removed and the spondylotic disk herniation removed, and we were able  the decompress the spinal canal and thecal sac.  We then turned our  attention to the neuroforamen, removed further spondylotic overgrowth  and spondylitic disk herniation, decompressing exiting nerve roots  bilaterally at each level.  Once the decompression was completed,  hemostasis was established with the use of Gelfoam soaked in thrombin  and then  measured the heights of the vertebral disk space and selected  two 7-mm allograft implants.  They were both hydrated in saline solution  and then positioned in the intervertebral disk space and countersunk.  We then discontinued the cervical traction and selected a 2-level  anterior cervical plate, it was positioned over the fusion construct and  secured to the vertebra with 4 x 14-mm variable angle screws.  Each  screw hole was drilled and tapped and place all 5 screws and then final  tightening was done.  An x-Golaszewski was taken, which showed screws at C5 and  C6 in good position.  Bone grafts in good position at each level.  Screws at C7  could not be visualized because the patient's shoulder's  overall alignment was good.   The wound was irrigated with Bacitracin solution, checked for  hemostasis, which was established and confirmed, and then we proceeded  with closure.  Platysma was closed with interrupted inverted 2-0 undyed  Vicryl sutures.  Subcutaneous and subcuticular layer closed with  interrupted inverted 3-0 undyed Vicryl sutures.  Skin was reapproximated  with Dermabond.  The procedure was tolerated well.  Estimated blood loss  was 50 mL.  Sponge and needle count were correct.  Following surgery,  the patient was to be reversed from the anesthetic, extubated, and  transferred to the recovery room for further care.      Hewitt Shorts, M.D.  Electronically Signed     RWN/MEDQ  D:  03/01/2009  T:  03/02/2009  Job:  [id number]

## 2011-02-11 NOTE — Assessment & Plan Note (Signed)
[facility]                            CARDIOLOGY OFFICE NOTE   ZURIEL, ROSKOS                        MRN:          [id number]  DATE:09/06/2007                            DOB:          Sep 25, 1961    Mr. Bubeck is in for a followup visit.  In general, he is doing pretty  well.  A couple of weeks ago, he felt an episode of fluttering.  He did  not have any lightheadedness sensation or any other kinds of  abnormality.  He unfortunately is continuing to smoke about 5-6  cigarettes a day.  He has decreased his aspirin to 81 mg.   PHYSICAL EXAMINATION:  VITAL SIGNS:  Blood pressure 118/90, pulse 72.  LUNGS:  Clear.  CARDIAC:  The rhythm is regular.  There is no significant murmur.   Electrocardiogram demonstrates normal sinus rhythm, essentially within  normal limits.   IMPRESSION:  1. Coronary artery disease with prior acute myocardial infarction      treated with primary stenting using an non-drug-eluting stent.  2. Hypercholesterolemia on lipid lowering therapy, at target on the      last lipid profile of April of 2008 with normal liver function      studies at that time, on Crestor 10 daily.  3. Continued tobacco use.  4. Rare palpitations.   PLAN:  1. Return to clinic in 6 months.  2. Continue current medical regimen.  3. Continued efforts to discontinue smoking.     [name]. Riley Kill, MD, [facility]  Electronically Signed    TDS/MedQ  DD: 09/06/2007  DT: 09/07/2007  Job #: [id number]

## 2011-02-11 NOTE — Assessment & Plan Note (Signed)
[facility]                            CARDIOLOGY OFFICE NOTE   PUNEET, MASONER                        MRN:          [id number]  DATE:04/03/2008                            DOB:          [birth year]    Mr. Resnik is in for followup.  In general, he is doing quite well.  He  denies any ongoing chest pain or progressive shortness of breath.  He  fortunately has stopped smoking, and he is fairly proud of this.   His current medications include Crestor 10 mg daily, Toprol 25 mg daily,  omega-3, and aspirin 81 mg daily.   On physical, he is alert and oriented in no acute distress.  Blood  pressure 136/90 and pulse 75.  Lung fields are clear.  Cardiac rhythm is  regular.   PLAN:  1. Continue current medical regimen.  2. Return to clinic in 6 months.  3. Continue to encourage nonsmoking.  4. Lipid and liver profile.     [name]. Riley Kill, MD, [facility]  Electronically Signed    TDS/MedQ  DD: 04/14/2008  DT: 04/14/2008  Job #: [id number]

## 2011-02-11 NOTE — Assessment & Plan Note (Signed)
[facility]                                 ON-CALL NOTE   [name], [name]                          MRN:          [id number]  DATE:08/23/2007                            DOB:          [birth year]    ATTENDING PHYSICIAN:  Madolyn Frieze. Jens Som, M.D.   PRIMARY CARDIOLOGIST:  [name]. Riley Kill, M.D.   [name] calls tonight complaining of fluttering in chest.  I returned a  call to him at [phone].  He expresses concern over two brief transient  episodes of fluttering in his chest, not associated with any activity or  position.  He states both times he was just sitting watching TV this  evening after getting home from work.  He denied light headedness,  dizziness, any type of chest discomfort. He states the sensation came on  quickly, felt like a fluttering sensation and it resolved immediately  and then returned briefly for a second episode that resolved  immediately.  He became alarmed though and thought he might better call  and get checked out.  I have reassured patient that he did not need to  be alarmed at this point.  If the sensation returned and he experienced  any light headedness, dizziness or presyncope with it or chest  discomfort that he needed to be evaluated or if it returned and  continued he needed to be evaluated this evening.      Dorian Pod, ACNP  Electronically Signed      Madolyn Frieze. Jens Som, MD, [facility]  Electronically Signed   MB/MedQ  DD: 08/23/2007  DT: 08/24/2007  Job #: [id number]

## 2011-02-14 NOTE — Assessment & Plan Note (Signed)
[facility]                            CARDIOLOGY OFFICE NOTE   [name], [name]                        MRN:          [id number]  DATE:08/19/2006                            DOB:          02/07/1961    [name] is in for followup.  He has recently had some arm discomfort  radiating to the shoulder.  He does some fairly heavy lifting.  This  went through to his neck, although it is subsequently resolved over the  past few days, and he has not had any recurrence.  Unfortunately, the  patient has resumed smoking.  He denies any ongoing chest pain or  significant shortness of breath.  The patient seems to be tolerating  Crestor well, and he is scheduled to have a lipid and liver profile and  followup.  On high dose Lipitor, he had elevated liver function  abnormalities.   MEDICATIONS:  1. Aspirin 325 mg daily.  2. Plavix 75 mg daily.  3. Toprol 25 mg 1/2 tablet daily.  4. Crestor 10 mg daily.  5. Aspirin 81 mg daily.   PHYSICAL EXAMINATION:  GENERAL:  He is an alert and oriented gentleman  in no acute distress.  VITAL SIGNS:  Blood pressure 126/84, pulse 85.  LUNGS:  Lung fields are clear.  HEART:  There is no significant murmur noted.  The cardiac rhythm is  regular.   The electrocardiogram demonstrates a normal sinus rhythm with T wave  inversion in the inferior leads.   Chest x-Sadowsky done during his hospitalization in July revealed mild  interstitial coarsening without acute cardiopulmonary process.   IMPRESSION:  1. Coronary artery disease, status post percutaneous coronary      intervention with stenting with non-drug eluting platform for an      acute inferior wall infarction.  2. Resumption of tobacco use.  3. Probable musculoskeletal arm discomfort, which is now resolved.  4. Elevated liver functions on lipid-lowering therapy.   PLAN:  1. Increase Toprol to 25 mg daily.  2. Lipid and liver profile soon.  3. Call back if any symptoms  would progress.     [name]. Riley Kill, MD, [facility]  Electronically Signed    TDS/MedQ  DD: 08/19/2006  DT: 08/19/2006  Job #: [id number]

## 2011-02-14 NOTE — H&P (Signed)
[name], [name] NO.:  [id number]   MEDICAL RECORD NO.:  [id number]          PATIENT TYPE:  INP   LOCATION:  2807                         FACILITY:  [facility]   PHYSICIAN:  Vida Roller, M.D.   DATE OF BIRTH:  [birth year]   DATE OF ADMISSION:  04/19/2006  DATE OF DISCHARGE:                                HISTORY & PHYSICAL   PRIMARY CARE PHYSICIAN:  Dr. Manus Gunning at [facility] in [place].   HISTORY OF PRESENT ILLNESS:  [name] is a 50 year old man with limited past  medical history, only cardiac risk factors are active tobacco abuse and a  family history of early cardiac disease in his mother, who presents with  acute onset of chest discomfort in the center of his chest associated with  diaphoresis and radiation to his jaw with some nausea and vomiting.  He  reports this to occur when he was walking his dogs this morning, activated  EMS relatively quickly, they saw him around 7 o'clock this morning, gave him  two sublingual nitroglycerin, got an EKG, which showed ST segment elevation  in the inferior leads, and he was emergently transferred to [facility] where he was evaluated, and the ER confirmed the ST segment  elevation with reciprocal changes in the anterior leads, and he was taken  emergently to the heart catheterization laboratory.  He did not lose  consciousness, had no other associated symptoms, no nausea or vomiting, no  PND or orthopnea.  He has had no episodes of discomfort prior to this.   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   SOCIAL HISTORY:  He works for [employer] in [employer].  He is a  guy, who does a lot of heavy activity.  He does smoke cigarettes and has  about a 20-pack-year smoking history.  Denies any illicit drug use,  specifically denies cocaine abuse, denies any alcohol abuse.  He is the  father of an 56-month-old daughter.   PAST SURGICAL HISTORY:  He has had elbow  surgery in his left elbow under  general endotracheal anesthetic without any trouble.   He is not allergic to any medications.  Denies any shellfish allergy.  Denies any allergies to iodine.   REVIEW OF SYSTEMS:  Negative.  He is not on any medications.  He received an  IV heparin bolus in the ER along with 81 mg of aspirin x4 pills chewed and  swallowed.   PHYSICAL EXAMINATION:  VITAL SIGNS:  His heart rate is 78, respirations are  17, his blood pressure is 132/92, saturating 100% on nasal cannula at 2  liters.  HEAD, EARS, EYES, NOSE, AND THROAT:  Unremarkable.  NECK:  Supple.  There is no jugulovenous distention or carotid bruits.  CARDIOVASCULAR:  Regular.  There is an S4.  There is no murmur.  LUNGS:  Clear to auscultation.  ABDOMEN:  Soft and nontender, normoactive bowel sounds.  No  hepatosplenomegaly. No bruits noted.  EXTREMITIES:  His lower extremities are without clubbing, cyanosis, or  edema.  Pulses  are 1+.  There are no bruits noted.  NEUROLOGIC:  A normal neurologic exam.  MUSCULOSKELETAL:  A normal musculoskeletal exam.   LABORATORY DATA:  Laboratories and chest x-[place] are pending.  His  electrocardiogram reveals a sinus rhythm at a rate of 68 with normal  intervals, a slightly prolonged QRS duration at 104 msec, normal QT  corrected.  He has about 3 mm of ST segment elevation in leads 2, 3, and  aVF, and about 1.5 to 2 mm of ST segment depression in leads V2, V3, and V4.  No right-sided EKGs were obtained.   ASSESSMENT:  1.  Acute ST segment elevation myocardial infarction likely in the right      coronary distribution.  2.  Ongoing tobacco abuse.  3.  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   PLAN:  To take him emergently to the catheterization laboratory and we will  stabilize him subsequent to the findings of the angiogram.      Vida Roller, M.D.  Electronically Signed     JH/MEDQ  D:  04/19/2006  T:  04/19/2006  Job:  [id number]

## 2011-02-14 NOTE — Procedures (Signed)
[facility]                                EXERCISE TREADMILL   NAME:[name], [name]                        MRN:          [id number]  DATE:05/26/2006                            DOB:          [birth year]    DURATION OF EXERCISE:  Nine minutes and 49 seconds.   MAXIMUM HEART RATE:  146, eighty-two percent of age-predicted maximum.   COMMENTS:  [name] exercised today on the Bruce protocol.  Overall, exercise  tolerance was excellent.  He experienced no chest pain.  The test was  terminated due to volitional fatigue.  At peak stress, there was minor  nonspecific ST-T abnormalities, but no significant ST depression that met  criteria for ischemia.  The changes were felt to be nonspecific.   The patient has undergone percutaneous stenting of the right coronary  artery.  Followup echocardiography has suggested good overall left  ventricular function without a definite wall motion abnormality.  The  patient has remained on aspirin and Plavix.  He also is on low-dose Toprol.  He continues on Lipitor; a lipid and liver profile will be obtained today.  I have told the patient that I thought that he could return to work next  week as previously planned.  He should use good judgment, but they are not  specific in restrictions.  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                                   [name]. Riley Kill, MD, [facility]   TDS/MedQ  DD:  05/26/2006  DT:  05/27/2006  Job #:  [id number]

## 2011-02-14 NOTE — Procedures (Signed)
[facility]                              EXERCISE TREADMILL   NAME:[name], [name]                        MRN:          [id number]  DATE:01/18/2007                            DOB:          October 10, 1960    The patient is a 50 year old male who has a history of coronary disease,  status post PCI of the right coronary artery in July 2007 as well as  residual circumflex disease.  This study is performed for risk  stratification.   The patient exercised for a duration of 9 minutes and 49 seconds on the  Bruce protocol, which is equivalent of 11.4 METS.  His heart rate  increased from a resting of 90 to a maximum of 155, which is 88% of his  predicted maximum.  His blood pressure increased from 119/82 to 164/75.  There was no chest pain or dyspnea during this study.  The study was  terminated secondary to leg fatigue.  The patient did have subtle ST  depression inferolaterally, but this was felt to be difficult to  interpret due to baseline changes and overall was felt to be non-  diagnostic.   Probable negative adequate exercise tolerance test with no chest pain  and good exercised tolerance.  There were borderline  electrocardiographic changes in the inferolateral leads that were felt  to be nondiagnostic, given his baseline changes.  Consideration could be  given to adding an imaging modality in the future for improved  sensitivity.     Madolyn Frieze Jens Som, MD, [facility]  Electronically Signed    BSC/MedQ  DD: 01/18/2007  DT: 01/19/2007  Job #: [id number]   cc:   [name]. Riley Kill, MD, [facility]

## 2011-02-14 NOTE — Op Note (Signed)
NAME:  [name], [name]                           ACCOUNT NO.:  [id number]   MEDICAL RECORD NO.:  [id number]                   PATIENT TYPE:  AMB   LOCATION:  DSC                                  FACILITY:  [facility]   PHYSICIAN:  Loreta Ave, M.D.              DATE OF BIRTH:  10/16/1960   DATE OF PROCEDURE:  12/28/2003  DATE OF DISCHARGE:                                 OPERATIVE REPORT   PREOPERATIVE DIAGNOSIS:  Complete rupture of distal biceps tendon off radial  tuberosity, right elbow.   POSTOPERATIVE DIAGNOSIS:  Complete rupture of distal biceps tendon off  radial tuberosity, right elbow.   OPERATION PERFORMED:  Repair, reinsertion of biceps tendon into radial  tuberosity attachment right elbow with FiberWire suture.   SURGEON:  Loreta Ave, M.D.   ASSISTANT:  Arlys John D. Petrarca, P.A.-C.   ANESTHESIA:  General.   ESTIMATED BLOOD LOSS:  Minimal.   TOURNIQUET TIME:  45 minutes.   SPECIMENS:  None.   CULTURES:  None.   COMPLICATIONS:  None.   DRESSING:  Soft compressive with a Bledsoe brace locked at 90 degrees.   DESCRIPTION OF PROCEDURE:  The patient was brought to the operating room and  after adequate anesthesia had been obtained, tourniquet applied to the upper  aspect of the right arm.  Prepped and draped in the usual sterile fashion.  Exsanguinated with elevation, Esmarch.  Tourniquet inflated 250 mmHg.  Small  transverse incision antecubital fossa.  Blunt dissection used to expose the  contents of the antecubital fossa.  Biceps tendon retrieved and brought out  through that incision.  Trimmed back to healthy tissue.  Well captured with  a FiberWire suture.  Pathway down the radial tuberosity developed bluntly.  Longitudinal incision dorsal aspect proximal forearm between the radius and  the ulna.  Blunt dissection used to expose the radial tuberosity between the  radius and ulna.  Sutures from the front were brought out through the back  along the normal  pathway.  Large drill hole made in the radius of the  tuberosity and two small exiting drill holes.  Stainless steel wire was used  to pull the FiberWire suture through the larger drill hole and out the  smaller ones.  The elbow was then flexed at 90 degrees and the forearm was  supinated.  Sutures were pulled tightly and then tied over a bony bridge on  the back side of the radius.  This gave a nice firm repair reinsertion of  tendon down into radius confirmed utilizing digital inspection from both  incisions.  I could bring it to just about full extension as long as the  forearm was supinated at completion.  Wounds were all  irrigated.  Fascia was closed with Vicryl.  Skin and subcutaneous tissue  with Vicryl.  Margins of the wound injected with Marcaine.  Steri-Strips  applied.  Sterile compressive dressing applied.  Tourniquet inflated and  removed.  Bledsoe brace applied.  Anesthesia reversed.  Brought to recovery  room.  Tolerated surgery well.  No complications.                                               Loreta Ave, M.D.    DFM/MEDQ  D:  12/28/2003  T:  12/28/2003  Job:  [id number]

## 2011-02-14 NOTE — Cardiovascular Report (Signed)
[name], [name] NO.:  [id number]   MEDICAL RECORD NO.:  [id number]          PATIENT TYPE:  INP   LOCATION:  2316                         FACILITY:  [facility]   PHYSICIAN:  [name]. Riley Kill, M.D. [facility] OF BIRTH:  [birth year]   DATE OF PROCEDURE:  04/19/2006  DATE OF DISCHARGE:                              CARDIAC CATHETERIZATION   INDICATIONS:  [name] is a 50 year old gentleman who presents with an acute  inferior wall infarction.  He was picked up by EMS, brought to the emergency  room and brought immediately to the cardiac catheterization laboratory.  Dr.  Dorethea Clan discussed the risks, benefits and alternatives of catheterization and  reperfusion therapy with the patient.  I have reviewed this with the patient  as he arrived in the laboratory.   PROCEDURES:  1.  Left heart catheterization.  2.  Selective coronary arteriography.  3.  Selective left ventriculography.  4.  PTCA and stenting of the right coronary artery.   DESCRIPTION OF PROCEDURE:  The patient was brought to the catheterization  laboratory, prepped and draped in the usual fashion.  Through an anterior  puncture, the right femoral artery was easily entered.  A 6-French sheath  was placed.  Views of the left and right coronary arteries were then  obtained.  With this, the right coronary artery was identified to be the  infarct-related artery.  Prior to ventriculography, we went ahead to  establish reperfusion.  The cath lab arrival was 8:06 a.m., and case was  started at 8:13, balloon inflation was 8:25 yielding a cath balloon time of  19 minutes.  The heparin and Integrilin had been given according to  protocol.  ACTs were checked throughout the case and found to be  appropriate.  Double bolus Integrilin was utilized.  The patient also  received 600 mg of oral clopidogrel.  We used a JR-4 guiding catheter with  side holes.  A Prowater wire was taken down across the lesion and  predilatation  done with a 2 mm balloon.  Following this, we carefully  assessed vessel size, and elected to place a non DES platform.  A 28 x 2.75  Liberte stent was placed in the right coronary artery with an excellent  angiographic appearance.  There was mild spasm distal to the stent  implantation site, this was relieved by intracoronary nitroglycerin.  Post  dilatation was done using a 3.25 Quantum Maverick balloon throughout the  course of the stent and these were taken to high pressures.  The patient  tolerated the procedure well.  Ventriculography was performed in the RAO  projection following this and following a pressure pullback the pigtail  catheter was removed. The 6-French sheath was upgraded to a 7-French sheath  for minimal oozing.  Femoral sheath was sewn into place.  There were no  complications.   HEMODYNAMIC DATA:  1.  Central aortic pressure 104/76, mean 90.  2.  Left ventricular pressure 107/70.  3.  No gradient on pullback across the aortic valve.   ANGIOGRAPHIC DATA:  1.  Left main was free of  critical disease.  2.  The LAD coursed to the apex.  There was a septal perforator and a      diagonal branch.  Other than minimal luminal irregularity, no evidence      of high-grade disease was noted.  3.  The circumflex provides a fairly large marginal branch that has a      proximal 60-70% eccentric stenosis that does not appear to be unstable.      Distally, the vessel was intact and is a large-caliber vessel.  4.  The right coronary artery is totally occluded at the junction of the      proximal and midvessel.  Following reperfusion therapy, a 100% stenosis      was reduced to 0%.  There is perhaps 10% narrowing just distal to the      stent site in an area of mild luminal irregularity.  The posterior      descending and posterolateral branches were large-caliber vessels that      were free of critical obstruction.  5.  Ventriculography was done in the RAO projection.  Estimated  ejection      fraction would be in the range of 50-55%.  There is inferior wall      hypokinesis.  Of note, there is evidence of some mitral valve prolapse      with some mild mitral regurgitation.  Follow-up echocardiography would      be recommended to assess this better.  The patient will be continued on      aspirin and Plavix.  Discontinuation of smoking will be strongly      recommended.  One year of Plavix would be required.  We will also      suggest cardiac rehabilitation.      [name]. Riley Kill, M.D. [facility]  Electronically Signed     TDS/MEDQ  D:  04/19/2006  T:  04/19/2006  Job:  [id number]   cc:   [name]. Riley Kill, M.D. [facility]  CV Laboratory  Patient's medical record  [name]. Manus Gunning, M.D.

## 2011-02-14 NOTE — Op Note (Signed)
   NAME:  [name], [name]                           ACCOUNT NO.:  [id number]   MEDICAL RECORD NO.:  [id number]                   PATIENT TYPE:  AMB   LOCATION:  DSC                                  FACILITY:  [facility]   PHYSICIAN:  Cindee Salt, M.D.                    DATE OF BIRTH:  09/10/1961   DATE OF PROCEDURE:  09/20/2002  DATE OF DISCHARGE:                                 OPERATIVE REPORT   PREOPERATIVE DIAGNOSIS:  Mass, right middle finger.   POSTOPERATIVE DIAGNOSIS:  Mass right middle finger.   OPERATION:  Excision, mass, right middle finger.   SURGEON:  Cindee Salt, M.D.   ASSISTANT:  R.N.   ANESTHESIA:  IV regional.   INDICATIONS:  The patient is a 50 year old male with a history of a crush  injury to his right middle finger approximately six months ago. He has had  increasing pain and swelling of the fingertip with pain with touching. X-  rays revealed a defect in the bone indicative of a cyst formation.   DESCRIPTION OF PROCEDURE:  The patient was brought to the operating room  where an IV regional anesthesia was carried out without difficulty. He was  prepped and draped using Betadine scrub and solution and the right arm was  left free.   A curvilinear incision was made over the tip of the finger and was carried  down through  the subcutaneous tissue. A pearly white mass was immediately  encountered. This was multiloculated. This was excised, separating it off  from the distal phalanx. The specimen was sent to pathology. No further  lesions were identified. The wound was irrigated.   The skin was closed with interrupted 5-0 nylon sutures. A sterile  compressive dressing  and splint with applied.   The patient tolerated the procedure well. He was transferred to the recovery  room for observation in satisfactory condition. He is discharged to home to  return to the [facility] in one week on Vicodin and Keflex.         Cindee Salt, M.D.    GK/MEDQ  D:  09/20/2002  T:  09/20/2002  Job:  [id number]

## 2011-02-14 NOTE — Discharge Summary (Signed)
[name], [name] NO.:  [id number]   MEDICAL RECORD NO.:  [id number]          PATIENT TYPE:  INP   LOCATION:  2002                         FACILITY:  [facility]   PHYSICIAN:  [name]. Riley Kill, M.D. [facility] OF BIRTH:  January 17, 1961   DATE OF ADMISSION:  04/19/2006  DATE OF DISCHARGE:  04/22/2006                                 DISCHARGE SUMMARY   PROCEDURES:  1.  Cardiac catheterization.  2.  Coronary arteriogram.  3.  Left ventriculogram.  4.  A 2-D echocardiogram.  5.  PTCA and bare-metal stent to the RCA.   PRIMARY DIAGNOSES:  1.  Acute inferior myocardial infarction.  2.  Mitral valve prolapse by contrast on echocardiogram with minimal mitral      regurgitation and no significant murmur.  3.  Hyperlipidemia with a total cholesterol of 210, triglycerides 79, HDL      29, LDL 165.  4.  Status post elbow surgery.  5.  Twenty pack-year tobacco history.  6.  Family history of premature coronary artery disease in his mother.  7.  Status post left shoulder rotator cuff repair.  8.  Degenerative joint disease.   M.D. time at discharge greater than 30 minutes.   HOSPITAL COURSE:  Mr. Kohlenberg is a 50 year old male with no previous history of  coronary artery disease.  He had pain that started at 6 a.m. on the day of  admission.  This was associated with shortness of breath, nausea, vomiting  and diaphoresis.  He called EMS at 7 a.m.  His pain decreased with  sublingual nitroglycerin and he was transported to the emergency room where  he had inferior ST elevation and was taken urgently to the catheterization  laboratory.   The culprit lesion was an RCA that was totaled proximally and reduced to 0%  stenosis with a bare-metal stent.  He has a 60% stenosis in the circumflex  for which medical therapy is recommended.  His EF was 50% with inferior  hypokinesis.  He had some MR on left ventriculogram so a 2-D echocardiogram  was ordered.   The 2-D echocardiogram showed  an EF of 55-60% with no regional wall motion  abnormalities and normal mitral valve structure with normal mitral valve  leaflet excursion and trivial MR, as well as trivial tricuspid  regurgitation.   Mr. Luten was monitored closely over the next 48 hours.  Because of his lipid  profile he was started on Lipitor 80.  He had an ACE inhibitor and a beta  blocker added to his medication regimen and tolerated these well.  He was  seen by cardiac rehab, smoking cessation and nutrition.   By April 22, 2006, Mr. Pawloski was feeling well.  He was evaluated by Dr. Riley Kill  and considered stable for discharge with outpatient followup arranged.   DISCHARGE INSTRUCTIONS:  1.  He is not to return to work until cleared by M.D.  2.  His activity level is to be per cardiac rehab guidelines.  3.  He is to stick to a low fat and salt diet.  4.  He is to follow up with Dr. Riley Kill on April 30, 2006, and get a repeat      echocardiogram at that time.   DISCHARGE MEDICATIONS:  1.  Aspirin 325 mg daily.  2.  Plavix 75 mg daily.  3.  Toprol-XL 25 mg one-half tablet daily.  4.  Lipitor 80 mg a day.      Theodore Demark, P.A. [facility]      Thomas D. Riley Kill, M.D. [facility]  Electronically Signed    RB/MEDQ  D:  05/06/2006  T:  05/06/2006  Job:  [id number]   cc:   Bryan Lemma. Manus Gunning, M.D.

## 2011-02-14 NOTE — Assessment & Plan Note (Signed)
[facility]                            CARDIOLOGY OFFICE NOTE   [name], [name]                        MRN:          [id number]  DATE:10/27/2006                            DOB:          20-Jul-1961    [name] is in for followup.  In general, he has been pretty stable.  He  has had a little bit of pain between the shoulder blades.  It is nothing  like what he had when he had an event and usually occurs after he sleeps  on a foam mattress at home.   EXAM:  Blood pressure is 124/82.  Pulse was 85.  The lung fields were clear.  CARDIAC:  Rhythm was regular.  When he first came in, his blood pressure  was 162/92 but he was very nervous at the time.  His cardiac exam was  unremarkable.  His lung fields are clear.   Recent laboratory studies included a LDL of 85 with liver function  studies that are normal on Crestor 10 mg.   IMPRESSION:  1. Coronary artery disease status post percutaneous intervention for      acute coronary syndrome.  2. Residual circumflex disease.  3. Hypercholesterolemia on lipid-lowering therapy near target with now      normal liver function studies on Crestor 10.  4. History of tobacco use, improved.   PLAN:  1. Return to the clinic for an exercise treadmill in 3 months.  2. Continue medical therapy.  3. Lipid and liver profile at that time.  4. I have asked him to get a blood pressure cuff and to check these      regularly.  If they are a sustained elevation, then we will      increase his beta blockade.     [name]. Riley Kill, MD, [facility]  Electronically Signed    TDS/MedQ  DD: 10/27/2006  DT: 10/27/2006  Job #: [id number]

## 2011-02-14 NOTE — Assessment & Plan Note (Signed)
[facility]                              CARDIOLOGY OFFICE NOTE   [name], [name]                        MRN:          [id number]  DATE:04/30/2006                            DOB:          Feb 10, 1961    [name] is in for followup.  He presented with an acute inferior wall  infarction.  He underwent primary PCI.  Non-drug eluting stent was placed.  His hemoglobin was normal.  His LDL was 165.  His peak enzyme was 120 Mb.  Followup echo today does not reveal significant wall motion abnormality.  The patient is feeling well.  He is not having chest pain.  He has stopped  smoking.   MEDICATIONS:  1.  Aspirin 325 mg daily.  2.  Plavix 75 mg daily.  3.  Toprol 25 mg 1/2 tablet daily.  4.  Lipitor 80 mg daily.   PHYSICAL EXAMINATION:  VITAL SIGNS:  Blood pressure is 112/80.  Pulse is 70.  LUNGS:  Lung fields clear.  CARDIAC:  Rhythm is regular.  Groin is healed.   EKG:  Reveals normal sinus rhythm.  There are normal R waves in 2, 3 and aVF  with T wave inversion noted in the inferior leads and this is the only  residual sign of prior infarction.   IMPRESSION:  1.  Acute myocardial infarction, convalescent phase, inferior.  2.  Hypercholesterolemia on lipid lowering therapy.  3.  Former tobacco user.   PLAN:  1.  Patient may return to work June 01, 2006.  2.  Exercise tolerance test.  3.  Cardiac rehabilitation.  4.  Lipid profile in four to six weeks.                              [name]. Riley Kill, MD, [facility]    TDS/MedQ  DD:  04/30/2006  DT:  04/30/2006  Job #:  [id number]

## 2011-02-14 NOTE — Assessment & Plan Note (Signed)
[facility]                              CARDIOLOGY OFFICE NOTE   NAME:RAYMaison, [name]                        MRN:          [id number]  DATE:05/26/2006                            DOB:          1961-02-09    EXERCISE TREADMILL:  Duration of exercise  9 minutes 49 seconds.  Maximum heart rate 146.  Percent of PMHR 82%.   COMMENTS:  [name] exercised today on the Bruce protocol.  Overall exercise  tolerance was good.  There was minor ST segment flattening, but post-  exercise tracings did not demonstrate persistent ST depression.  At peak  exertion there was borderline ST segment change, but recovery tracings did  not suggest significant ischemia.   The patient has not been having ongoing chest pain.  He had an acute  myocardial infarction with a smooth 60-70% eccentric stenosis in the  circumflex territory and a totally-occluded right coronary artery that was  stented with a non-drug eluding stent and post-dilated with a 3.25 mm  Quantum balloon.  His overall systolic function in early August was normal.   At the present time, he will continue on his low-dose beta-blockade as well  as aspirin and Plavix.  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  I will see him back in followup in a couple of  months.                              [name]. Riley Kill, MD, [facility]    TDS/MedQ  DD:  05/30/2006  DT:  05/31/2006  Job #:  [id number]

## 2011-02-25 ENCOUNTER — Telehealth: Payer: Self-pay | Admitting: Cardiology

## 2011-02-25 NOTE — Telephone Encounter (Signed)
(  [phone], ID #[id number]) The pt currently takes Crestor 10mg  daily, the pt has taken Lipitor in the past.

## 2011-02-25 NOTE — Telephone Encounter (Signed)
Wife called and said that his insurance is going to require pre cert on Crestor.  Pt has 1 week left.  There is no number on letter that she received regarding same. Please let her know what to do.

## 2011-03-07 NOTE — Telephone Encounter (Signed)
Doree Fudge called in regards to prior authorization for Crestor. No prior authorization required per insurance company.  They have already sent out a 90 day supply of Crestor to the pt.  Doree Fudge left a message for the pt in regards to this information.

## 2011-03-10 ENCOUNTER — Telehealth: Payer: Self-pay | Admitting: Cardiology

## 2011-03-10 NOTE — Telephone Encounter (Signed)
Calling back to speak with nurse

## 2011-03-10 NOTE — Telephone Encounter (Signed)
I spoke with the pt and his wife and made them aware that at this time Crestor did not require a prior authorization.

## 2011-07-14 LAB — DIFFERENTIAL
Basophils Absolute: 0
Basophils Relative: 0
Eosinophils Absolute: 0.1
Lymphocytes Relative: 28
Lymphs Abs: 1.5
Neutro Abs: 3.2
Neutrophils Relative %: 59

## 2011-07-14 LAB — BASIC METABOLIC PANEL
BUN: 11
CO2: 27
Chloride: 111
Creatinine, Ser: 0.92
Glucose, Bld: 111 — ABNORMAL HIGH
Potassium: 3.8

## 2011-07-14 LAB — CK TOTAL AND CKMB (NOT AT ARMC)
CK, MB: 1.5
CK, MB: 2.1
Relative Index: INVALID
Total CK: 152
Total CK: 99

## 2011-07-14 LAB — POCT CARDIAC MARKERS
CKMB, poc: 1
CKMB, poc: <1 — ABNORMAL LOW
Myoglobin, poc: 54.5
Myoglobin, poc: 57.7
Operator id: 285491
Troponin i, poc: <0.05

## 2011-07-14 LAB — COMPREHENSIVE METABOLIC PANEL
ALT: 39
AST: 31
Albumin: 4
Alkaline Phosphatase: 58
Chloride: 108
GFR calc Af Amer: >60
GFR calc non Af Amer: >60
Total Protein: 6.5

## 2011-07-14 LAB — CBC
Hemoglobin: 13.7
MCHC: 34.3
Platelets: 155

## 2011-07-14 LAB — TROPONIN I
Troponin I: 0.01
Troponin I: <0.01

## 2011-07-14 LAB — LIPID PANEL
Cholesterol: 118
HDL: 29 — ABNORMAL LOW
Triglycerides: 113

## 2011-07-14 LAB — PROTIME-INR: Prothrombin Time: 12.9

## 2011-08-04 ENCOUNTER — Encounter (INDEPENDENT_AMBULATORY_CARE_PROVIDER_SITE_OTHER): Payer: Self-pay | Admitting: General Surgery

## 2011-08-07 ENCOUNTER — Ambulatory Visit (INDEPENDENT_AMBULATORY_CARE_PROVIDER_SITE_OTHER): Payer: Managed Care, Other (non HMO) | Admitting: General Surgery

## 2011-08-07 ENCOUNTER — Encounter (INDEPENDENT_AMBULATORY_CARE_PROVIDER_SITE_OTHER): Payer: Self-pay | Admitting: General Surgery

## 2011-08-07 ENCOUNTER — Telehealth: Payer: Self-pay | Admitting: Cardiology

## 2011-08-07 VITALS — BP 124/88 | HR 68 | Temp 97.9°F | Resp 20 | Ht 67.0 in | Wt 169.1 lb

## 2011-08-07 DIAGNOSIS — R1909 Other intra-abdominal and pelvic swelling, mass and lump: Secondary | ICD-10-CM

## 2011-08-07 DIAGNOSIS — R1900 Intra-abdominal and pelvic swelling, mass and lump, unspecified site: Secondary | ICD-10-CM

## 2011-08-07 NOTE — Telephone Encounter (Signed)
I did receive a fax from the pt's insurance yesterday.  I will review this fax with Dr Riley Kill on 08/08/11.

## 2011-08-07 NOTE — Telephone Encounter (Signed)
Insurance sent fax for auth on crestor pt's wife checking on status, pls call [phone] wife Bjorn Loser

## 2011-08-07 NOTE — Patient Instructions (Signed)
We will get an ultrasound of the area.  I will call you with the results. Once we get the results, we will schedule your surgery

## 2011-08-07 NOTE — Telephone Encounter (Signed)
Pt's med will cover the med if we notate that he has tried "cheaper" versions like lipitor, but it increased kidney function and they should cover crestor as long as he has tried other meds without success, pls call [phone]

## 2011-08-07 NOTE — Progress Notes (Signed)
Chief Complaint  Patient presents with  . Routine Post Op    recheck growth in left groin it has grown larger and is causing some pain      HPI [name] is a 50 y.o. male.  HPI 50 year old Caucasian male comes in for long-term followup. I last saw him in February 2011 for a left suprapubic subcutaneous mass. It appears he never scheduled surgery.  He states that it has gotten larger since February. She will call some occasional discomfort. He describes it as an aggravating sensation. He denies any trauma to the area. He denies any overlying skin changes. He denies any fevers, chills, weight loss, night sweats, testicular pain, dysuria, or bulge in his groin. He denies any personal or family history of malignancy. He states that it initially was the size of a pea but is slowly gotten larger over the past several years. Past Medical History  Diagnosis Date  . Hypertension   . Hyperlipidemia   . History of heart attack     seen by Dr Riley Kill  . Myocardial infarction 2005-2006?    Past Surgical History  Procedure Date  . Neck surgery 2009  . Cardiac catheterization 2008  . Elbow surgery     left elbow   . Bicep surgery      right bicep     Family History  Problem Relation Age of Onset  . Heart attack Mother   . Aneurysm Father     Social History History  Substance Use Topics  . Smoking status: Current Everyday Smoker -- 0.5 packs/day  . Smokeless tobacco: Never Used  . Alcohol Use: No    No Known Allergies  Current Outpatient Prescriptions  Medication Sig Dispense Refill  . aspirin 81 MG tablet Take 81 mg by mouth daily.        Marland Kitchen buPROPion (WELLBUTRIN XL) 150 MG 24 hr tablet       . CALCIUM PO Take by mouth daily.        . fish oil-omega-3 fatty acids 1000 MG capsule Take 2 g by mouth daily.        . metoprolol succinate (TOPROL-XL) 25 MG 24 hr tablet Take 25 mg by mouth daily.        . rosuvastatin (CRESTOR) 10 MG tablet Take 10 mg by mouth daily.           Review of Systems Review of Systems  Constitutional: Negative for fever, chills and unexpected weight change.  HENT: Negative for hearing loss, congestion, sore throat, trouble swallowing and voice change.   Eyes: Negative for visual disturbance.  Respiratory: Negative for cough, shortness of breath and wheezing.   Cardiovascular: Negative for chest pain, palpitations and leg swelling.  Gastrointestinal: Negative for nausea, vomiting, abdominal pain, diarrhea, constipation, blood in stool, abdominal distention, anal bleeding and rectal pain.  Genitourinary: Negative for dysuria, urgency, hematuria, scrotal swelling, difficulty urinating, genital sores and testicular pain.  Musculoskeletal: Negative for arthralgias.  Skin: Negative for rash and wound.  Neurological: Negative for seizures, syncope, weakness and headaches.  Hematological: Negative for adenopathy. Does not bruise/bleed easily.  Psychiatric/Behavioral: Negative for confusion.    Blood pressure 124/88, pulse 68, temperature 97.9 F (36.6 C), temperature source Temporal, resp. rate 20, height 5\' 7"  (1.702 m), weight 169 lb 2 oz (76.715 kg).  Physical Exam Physical Exam  Vitals reviewed. Constitutional: He is oriented to person, place, and time. He appears well-developed and well-nourished. No distress.  HENT:  Head: Normocephalic and  atraumatic.  Eyes: Conjunctivae are normal. No scleral icterus.  Neck: Normal range of motion. Neck supple. No thyromegaly present.  Cardiovascular: Normal rate, regular rhythm and normal heart sounds.   Pulmonary/Chest: Effort normal and breath sounds normal. No respiratory distress. He has no wheezes.  Abdominal: Soft. Bowel sounds are normal. He exhibits no distension and no mass. There is no tenderness. Hernia confirmed negative in the right inguinal area and confirmed negative in the left inguinal area.  Genitourinary: Testes normal and penis normal.    Right testis shows no mass.  Left testis shows no mass.       4cm well circumscribed soft, subcu left suprapubic mass. Nontender, no fluctuance. No overlying skin changes.   Musculoskeletal: Normal range of motion.  Lymphadenopathy:       Right: No inguinal adenopathy present.       Left: No inguinal adenopathy present.  Neurological: He is alert and oriented to person, place, and time. He exhibits normal muscle tone.  Skin: Skin is warm and dry. No rash noted. No erythema.  Psychiatric: He has a normal mood and affect. His behavior is normal.    Data Reviewed I reviewed my note from 10/30/2009  Assessment    Left suprapubic subcutaneous mass    Plan    It does appear slightly larger today than it did in February. This appears most consistent with either a sebaceous cyst or small lipoma. This is not consistent with an inguinal hernia on exam. There are no scrotal or testicular masses.  However since it is slightly larger, I would like to get a scrotal ultrasound to further evaluate it. The patient would like it surgically excised. I think this is reasonable but first Will get the scrotal ultrasound.  We discussed the risk and benefits of surgery including but not limited to bleeding, infection, seroma formation, scarring, injury to surrounding structures, as well as the typical postoperative course. We also discussed the possibility of additional procedures should the pathology abnormal.  I told the patient we would get the ultrasound first and then call him with the results.  As long as the ultrasound is not abnormal, we will proceed with excision of the left suprapubic mass in the near future  Timeka Goette M. Andrey Campanile, MD, FACS General, Bariatric, & Minimally Invasive Surgery [facility], [place]        [facility] M 08/07/2011, 3:36 PM

## 2011-08-07 NOTE — Telephone Encounter (Signed)
I spoke with the pt's wife and made her aware that our office is attempting to get a prior authorization for Crestor.

## 2011-08-08 ENCOUNTER — Other Ambulatory Visit: Payer: Self-pay | Admitting: Cardiology

## 2011-08-11 ENCOUNTER — Telehealth: Payer: Self-pay | Admitting: Cardiology

## 2011-08-11 NOTE — Telephone Encounter (Signed)
Ins wont pay for crestor. Please change to another med

## 2011-08-12 ENCOUNTER — Other Ambulatory Visit (INDEPENDENT_AMBULATORY_CARE_PROVIDER_SITE_OTHER): Payer: Self-pay | Admitting: General Surgery

## 2011-08-12 ENCOUNTER — Ambulatory Visit
Admission: RE | Admit: 2011-08-12 | Discharge: 2011-08-12 | Disposition: A | Discharge status: Home / Self Care | Payer: Managed Care, Other (non HMO) | Source: Ambulatory Visit | Attending: General Surgery | Admitting: General Surgery

## 2011-08-12 DIAGNOSIS — R52 Pain, unspecified: Secondary | ICD-10-CM

## 2011-08-12 DIAGNOSIS — R1909 Other intra-abdominal and pelvic swelling, mass and lump: Secondary | ICD-10-CM

## 2011-08-12 NOTE — Telephone Encounter (Signed)
Please see 08/11/11 phone encounter.  Crestor was not approved.

## 2011-08-12 NOTE — Telephone Encounter (Signed)
I received a letter from [employer] stating that Crestor has been approved.  I spoke with Thurston Hole the pharmacist with [employer] and made her aware that the Crestor was approved.  She will get the pt's medication sent to the patient.

## 2011-08-12 NOTE — Telephone Encounter (Signed)
Dr Riley Kill is out of the office this week.  I spoke with the pt and placed a 28 day supply of Crestor 10mg  at the front desk for pt to pick-up.  I will speak with Dr Riley Kill next week to determine what cholesterol medication he would like the pt to take.  The approved drugs are Atorvastatin, Simvastatin, Lovastatin and Pravastatin.  The pt has taken Atrorvastatin in the past and had increased LFTs.

## 2011-08-13 ENCOUNTER — Telehealth (INDEPENDENT_AMBULATORY_CARE_PROVIDER_SITE_OTHER): Payer: Self-pay | Admitting: General Surgery

## 2011-08-13 ENCOUNTER — Other Ambulatory Visit: Payer: Self-pay

## 2011-08-13 NOTE — Telephone Encounter (Signed)
Patient made aware ultrasound shows this area looks like cyst. Dr Andrey Campanile still recommends removal. I explained to the patient and he wishes to proceed. I will have Dr Andrey Campanile write surgical orders and we will call patient to set up surgery.

## 2011-08-13 NOTE — Telephone Encounter (Signed)
Message copied by Liliana Cline on Wed Aug 13, 2011  4:59 PM ------      Message from: Andrey Campanile, ERIC M      Created: Tue Aug 12, 2011  5:39 PM       pls call pt - u/s suggests a cyst. rec still proceeding with surgery.

## 2011-08-14 ENCOUNTER — Other Ambulatory Visit (INDEPENDENT_AMBULATORY_CARE_PROVIDER_SITE_OTHER): Payer: Self-pay | Admitting: General Surgery

## 2011-09-08 ENCOUNTER — Encounter (HOSPITAL_BASED_OUTPATIENT_CLINIC_OR_DEPARTMENT_OTHER): Payer: Self-pay | Admitting: *Deleted

## 2011-09-08 NOTE — Progress Notes (Signed)
To come in for ekg and bmet 

## 2011-09-10 ENCOUNTER — Encounter (HOSPITAL_BASED_OUTPATIENT_CLINIC_OR_DEPARTMENT_OTHER): Payer: Self-pay | Admitting: *Deleted

## 2011-09-10 ENCOUNTER — Encounter (HOSPITAL_BASED_OUTPATIENT_CLINIC_OR_DEPARTMENT_OTHER)
Admission: RE | Admit: 2011-09-10 | Discharge: 2011-09-10 | Disposition: A | Discharge status: Home / Self Care | Payer: Managed Care, Other (non HMO) | Source: Ambulatory Visit | Attending: General Surgery | Admitting: General Surgery

## 2011-09-10 ENCOUNTER — Other Ambulatory Visit: Payer: Self-pay

## 2011-09-10 LAB — BASIC METABOLIC PANEL
BUN: 16 mg/dL (ref 6–23)
Calcium: 9.5 mg/dL (ref 8.4–10.5)
Creatinine, Ser: 0.82 mg/dL (ref 0.50–1.35)
GFR calc non Af Amer: >90 mL/min (ref >90)
Glucose, Bld: 111 mg/dL — ABNORMAL HIGH (ref 70–99)

## 2011-09-11 ENCOUNTER — Ambulatory Visit (HOSPITAL_BASED_OUTPATIENT_CLINIC_OR_DEPARTMENT_OTHER)
Admission: RE | Admit: 2011-09-11 | Discharge: 2011-09-11 | Disposition: A | Discharge status: Home / Self Care | Payer: Managed Care, Other (non HMO) | Source: Ambulatory Visit | Attending: General Surgery | Admitting: General Surgery

## 2011-09-11 ENCOUNTER — Encounter (HOSPITAL_BASED_OUTPATIENT_CLINIC_OR_DEPARTMENT_OTHER): Payer: Self-pay | Admitting: Anesthesiology

## 2011-09-11 ENCOUNTER — Encounter (HOSPITAL_BASED_OUTPATIENT_CLINIC_OR_DEPARTMENT_OTHER): Payer: Self-pay | Admitting: *Deleted

## 2011-09-11 ENCOUNTER — Ambulatory Visit (HOSPITAL_BASED_OUTPATIENT_CLINIC_OR_DEPARTMENT_OTHER): Payer: Managed Care, Other (non HMO) | Admitting: Anesthesiology

## 2011-09-11 ENCOUNTER — Encounter (HOSPITAL_BASED_OUTPATIENT_CLINIC_OR_DEPARTMENT_OTHER)
Admission: RE | Disposition: A | Discharge status: Home / Self Care | Payer: Self-pay | Source: Ambulatory Visit | Attending: General Surgery

## 2011-09-11 ENCOUNTER — Telehealth (INDEPENDENT_AMBULATORY_CARE_PROVIDER_SITE_OTHER): Payer: Self-pay | Admitting: General Surgery

## 2011-09-11 DIAGNOSIS — F17200 Nicotine dependence, unspecified, uncomplicated: Secondary | ICD-10-CM | POA: Insufficient documentation

## 2011-09-11 DIAGNOSIS — I252 Old myocardial infarction: Secondary | ICD-10-CM | POA: Insufficient documentation

## 2011-09-11 DIAGNOSIS — L723 Sebaceous cyst: Secondary | ICD-10-CM | POA: Insufficient documentation

## 2011-09-11 DIAGNOSIS — J449 Chronic obstructive pulmonary disease, unspecified: Secondary | ICD-10-CM | POA: Insufficient documentation

## 2011-09-11 DIAGNOSIS — I2510 Atherosclerotic heart disease of native coronary artery without angina pectoris: Secondary | ICD-10-CM | POA: Insufficient documentation

## 2011-09-11 DIAGNOSIS — Z01812 Encounter for preprocedural laboratory examination: Secondary | ICD-10-CM | POA: Insufficient documentation

## 2011-09-11 DIAGNOSIS — Z9861 Coronary angioplasty status: Secondary | ICD-10-CM | POA: Insufficient documentation

## 2011-09-11 DIAGNOSIS — J4489 Other specified chronic obstructive pulmonary disease: Secondary | ICD-10-CM | POA: Insufficient documentation

## 2011-09-11 DIAGNOSIS — Z01818 Encounter for other preprocedural examination: Secondary | ICD-10-CM | POA: Insufficient documentation

## 2011-09-11 DIAGNOSIS — I10 Essential (primary) hypertension: Secondary | ICD-10-CM | POA: Insufficient documentation

## 2011-09-11 DIAGNOSIS — K219 Gastro-esophageal reflux disease without esophagitis: Secondary | ICD-10-CM | POA: Insufficient documentation

## 2011-09-11 HISTORY — DX: Atherosclerotic heart disease of native coronary artery without angina pectoris: I25.10

## 2011-09-11 HISTORY — PX: MASS EXCISION: SHX2000

## 2011-09-11 SURGERY — EXCISION MASS
Anesthesia: Monitor Anesthesia Care | Site: Groin | Laterality: Left | Wound class: Clean Contaminated

## 2011-09-11 MED ORDER — MORPHINE SULFATE 2 MG/ML IJ SOLN: 1.0000 mg | INTRAMUSCULAR | Status: DC | PRN

## 2011-09-11 MED ORDER — PROMETHAZINE HCL 25 MG/ML IJ SOLN: 6.2500 mg | INTRAMUSCULAR | Status: DC | PRN

## 2011-09-11 MED ORDER — OXYCODONE-ACETAMINOPHEN 5-325 MG PO TABS: 1.0000 | ORAL_TABLET | ORAL | Status: DC | PRN

## 2011-09-11 MED ORDER — ONDANSETRON HCL 4 MG/2ML IJ SOLN: 4.0000 mg | Freq: Four times a day (QID) | INTRAMUSCULAR | Status: DC | PRN

## 2011-09-11 MED ORDER — CEFAZOLIN SODIUM-DEXTROSE 2-3 GM-% IV SOLR
2.0000 g | INTRAVENOUS | Status: AC
  Administered 2011-09-11: 2 g via INTRAVENOUS

## 2011-09-11 MED ORDER — OXYCODONE HCL 5 MG PO TABS
5.0000 mg | ORAL_TABLET | ORAL | Status: DC | PRN
Start: 2011-09-11 — End: 2011-09-11

## 2011-09-11 MED ORDER — ACETAMINOPHEN 650 MG RE SUPP: 650.0000 mg | RECTAL | Status: DC | PRN

## 2011-09-11 MED ORDER — SODIUM CHLORIDE 0.9 % IJ SOLN: 3.0000 mL | INTRAMUSCULAR | Status: DC | PRN

## 2011-09-11 MED ORDER — PROPOFOL 10 MG/ML IV EMUL
INTRAVENOUS | Status: DC | PRN
  Administered 2011-09-11: 100 ug/kg/min via INTRAVENOUS

## 2011-09-11 MED ORDER — ACETAMINOPHEN 325 MG PO TABS: 650.0000 mg | ORAL_TABLET | ORAL | Status: DC | PRN

## 2011-09-11 MED ORDER — BUPIVACAINE-EPINEPHRINE 0.25% -1:200000 IJ SOLN
INTRAMUSCULAR | Status: DC | PRN
  Administered 2011-09-11: 20 mL

## 2011-09-11 MED ORDER — PROMETHAZINE HCL 25 MG/ML IJ SOLN: 12.5000 mg | Freq: Four times a day (QID) | INTRAMUSCULAR | Status: DC | PRN

## 2011-09-11 MED ORDER — MIDAZOLAM HCL 5 MG/5ML IJ SOLN
INTRAMUSCULAR | Status: DC | PRN
  Administered 2011-09-11: 2 mg via INTRAVENOUS

## 2011-09-11 MED ORDER — LACTATED RINGERS IV SOLN
INTRAVENOUS | Status: DC
  Administered 2011-09-11 (×2): via INTRAVENOUS

## 2011-09-11 MED ORDER — SODIUM CHLORIDE 0.9 % IJ SOLN: 3.0000 mL | Freq: Two times a day (BID) | INTRAMUSCULAR | Status: DC

## 2011-09-11 MED ORDER — ONDANSETRON HCL 4 MG/2ML IJ SOLN
INTRAMUSCULAR | Status: DC | PRN
  Administered 2011-09-11: 4 mg via INTRAVENOUS

## 2011-09-11 MED ORDER — HYDROMORPHONE HCL PF 1 MG/ML IJ SOLN: 0.2500 mg | INTRAMUSCULAR | Status: DC | PRN

## 2011-09-11 MED ORDER — SODIUM CHLORIDE 0.9 % IV SOLN: 250.0000 mL | INTRAVENOUS | Status: DC | PRN

## 2011-09-11 MED ORDER — FENTANYL CITRATE 0.05 MG/ML IJ SOLN
INTRAMUSCULAR | Status: DC | PRN
  Administered 2011-09-11: 100 ug via INTRAVENOUS

## 2011-09-11 SURGICAL SUPPLY — 40 items
BENZOIN TINCTURE PRP APPL 2/3 (GAUZE/BANDAGES/DRESSINGS) IMPLANT
BLADE HEX COATED 2.75 (ELECTRODE) ×2 IMPLANT
BLADE SURG 15 STRL LF DISP TIS (BLADE) ×1 IMPLANT
BLADE SURG 15 STRL SS (BLADE) ×1
BLADE SURG ROTATE 9660 (MISCELLANEOUS) ×2 IMPLANT
CANISTER SUCTION 1200CC (MISCELLANEOUS) IMPLANT
CHLORAPREP W/TINT 26ML (MISCELLANEOUS) ×2 IMPLANT
COVER MAYO STAND STRL (DRAPES) ×2 IMPLANT
COVER TABLE BACK 60X90 (DRAPES) ×2 IMPLANT
DECANTER SPIKE VIAL GLASS SM (MISCELLANEOUS) IMPLANT
DERMABOND ADVANCED (GAUZE/BANDAGES/DRESSINGS) ×1
DERMABOND ADVANCED .7 DNX12 (GAUZE/BANDAGES/DRESSINGS) ×1 IMPLANT
DRAPE PED LAPAROTOMY (DRAPES) ×2 IMPLANT
DRAPE UTILITY XL STRL (DRAPES) ×2 IMPLANT
DRSG TEGADERM 4X4.75 (GAUZE/BANDAGES/DRESSINGS) IMPLANT
ELECT COATED BLADE 2.86 ST (ELECTRODE) IMPLANT
ELECT REM PT RETURN 9FT ADLT (ELECTROSURGICAL) ×2
ELECTRODE REM PT RTRN 9FT ADLT (ELECTROSURGICAL) ×1 IMPLANT
GAUZE PACKING IODOFORM 1/4X5 (PACKING) IMPLANT
GAUZE SPONGE 4X4 12PLY STRL LF (GAUZE/BANDAGES/DRESSINGS) IMPLANT
GLOVE BIO SURGEON STRL SZ7.5 (GLOVE) ×4 IMPLANT
GLOVE BIOGEL PI IND STRL 8 (GLOVE) ×1 IMPLANT
GLOVE BIOGEL PI INDICATOR 8 (GLOVE) ×1
GLOVE ECLIPSE 6.5 STRL STRAW (GLOVE) ×4 IMPLANT
GOWN PREVENTION PLUS XLARGE (GOWN DISPOSABLE) ×2 IMPLANT
GOWN PREVENTION PLUS XXLARGE (GOWN DISPOSABLE) ×2 IMPLANT
NEEDLE HYPO 25X1 1.5 SAFETY (NEEDLE) ×2 IMPLANT
NS IRRIG 1000ML POUR BTL (IV SOLUTION) ×2 IMPLANT
PACK BASIN DAY SURGERY FS (CUSTOM PROCEDURE TRAY) ×2 IMPLANT
PENCIL BUTTON HOLSTER BLD 10FT (ELECTRODE) ×2 IMPLANT
SLEEVE SCD COMPRESS KNEE MED (MISCELLANEOUS) ×2 IMPLANT
STRIP CLOSURE SKIN 1/2X4 (GAUZE/BANDAGES/DRESSINGS) IMPLANT
SUT MNCRL AB 4-0 PS2 18 (SUTURE) ×2 IMPLANT
SUT VICRYL 3-0 CR8 SH (SUTURE) ×2 IMPLANT
SWAB CULTURE LIQ STUART DBL (MISCELLANEOUS) IMPLANT
SYR CONTROL 10ML LL (SYRINGE) ×2 IMPLANT
TOWEL OR 17X24 6PK STRL BLUE (TOWEL DISPOSABLE) ×2 IMPLANT
TOWEL OR NON WOVEN STRL DISP B (DISPOSABLE) ×2 IMPLANT
TUBE CONNECTING 20X1/4 (TUBING) IMPLANT
UNDERPAD 30X30 INCONTINENT (UNDERPADS AND DIAPERS) IMPLANT

## 2011-09-11 NOTE — Transfer of Care (Signed)
Immediate Anesthesia Transfer of Care Note  Patient: [name]  Procedure(s) Performed:  EXCISION MASS - excision of left groin subcutaenous mass  Patient Location: PACU  Anesthesia Type: MAC  Level of Consciousness: awake and alert   Airway & Oxygen Therapy: Patient Spontanous Breathing and Patient connected to face mask oxygen  Post-op Assessment: Report given to PACU RN and Post -op Vital signs reviewed and stable  Post vital signs: Reviewed and stable  Complications: No apparent anesthesia complications

## 2011-09-11 NOTE — Anesthesia Postprocedure Evaluation (Signed)
  Anesthesia Post-op Note  Patient: [name]  Procedure(s) Performed:  EXCISION MASS - excision of left groin subcutaenous mass  Patient Location: PACU  Anesthesia Type: MAC  Level of Consciousness: awake, alert  and oriented  Airway and Oxygen Therapy: Patient Spontanous Breathing  Post-op Pain: none  Post-op Assessment: Post-op Vital signs reviewed, Patient's Cardiovascular Status Stable, Respiratory Function Stable, Patent Airway, No signs of Nausea or vomiting, Adequate PO intake and Pain level controlled  Post-op Vital Signs: Reviewed and stable  Complications: No apparent anesthesia complications

## 2011-09-11 NOTE — Anesthesia Procedure Notes (Signed)
Procedure Name: MAC Date/Time: 09/11/2011 10:27 AM Performed by: Gladys Damme Pre-anesthesia Checklist: Patient identified, Timeout performed, Emergency Drugs available, Suction available and Patient being monitored Patient Re-evaluated:Patient Re-evaluated prior to inductionOxygen Delivery Method: Simple face mask Placement Confirmation: positive ETCO2

## 2011-09-11 NOTE — Telephone Encounter (Signed)
Patient scheduled for 10/03/11 and advised to call if has any problems and needs seen sooner.

## 2011-09-11 NOTE — Anesthesia Preprocedure Evaluation (Signed)
Anesthesia Evaluation  Patient identified by MRN, date of birth, ID band Patient awake    Reviewed: Allergy & Precautions, H&P , NPO status , Patient's Chart, lab work & pertinent test results, reviewed documented beta blocker date and time   Airway Mallampati: I TM Distance: >3 FB Neck ROM: Full    Dental No notable dental hx. (+) Teeth Intact   Pulmonary COPD (daily inhalers for wheezing) COPD inhaler, Current Smoker,  clear to auscultation  Pulmonary exam normal       Cardiovascular hypertension, Pt. on medications and Pt. on home beta blockers + CAD (2008 cath-  stent placed, remaining 2v disease non-obstructive, inf hypokinesis, EF 50%,  no  chest pain since stent placed) and + Past MI (2008 ) Regular Normal    Neuro/Psych S/p cervical fusion Negative Neurological ROS     GI/Hepatic Neg liver ROS, GERD-  Controlled,  Endo/Other  Negative Endocrine ROS  Renal/GU negative Renal ROS     Musculoskeletal   Abdominal   Peds  Hematology   Anesthesia Other Findings   Reproductive/Obstetrics                           Anesthesia Physical Anesthesia Plan  ASA: III  Anesthesia Plan: MAC   Post-op Pain Management:    Induction:   Airway Management Planned: Simple Face Mask  Additional Equipment:   Intra-op Plan:   Post-operative Plan:   Informed Consent: I have reviewed the patients History and Physical, chart, labs and discussed the procedure including the risks, benefits and alternatives for the proposed anesthesia with the patient or authorized representative who has indicated his/her understanding and acceptance.   Dental advisory given  Plan Discussed with: CRNA and Surgeon  Anesthesia Plan Comments: (Plan routine monitors, MAC with local by surgeon.)        Anesthesia Quick Evaluation

## 2011-09-11 NOTE — H&P (Signed)
Chief Complaint   Patient presents with   .  Routine Post Op     recheck growth in left groin it has grown larger and is causing some pain   HPI  [name] is a 50 y.o. male.   HPI  50 year old Caucasian male comes in for long-term followup. I last saw him in February 2011 for a left suprapubic subcutaneous mass. It appears he never scheduled surgery.  He states that it has gotten larger since February. She will call some occasional discomfort. He describes it as an aggravating sensation. He denies any trauma to the area. He denies any overlying skin changes. He denies any fevers, chills, weight loss, night sweats, testicular pain, dysuria, or bulge in his groin. He denies any personal or family history of malignancy. He states that it initially was the size of a pea but is slowly gotten larger over the past several years.   Past Medical History   Diagnosis  Date   .  Hypertension    .  Hyperlipidemia    .  History of heart attack      seen by Dr Riley Kill   .  Myocardial infarction  2005-2006?    Past Surgical History   Procedure  Date   .  Neck surgery  2009   .  Cardiac catheterization  2008   .  Elbow surgery      left elbow   .  Bicep surgery      right bicep    Family History   Problem  Relation  Age of Onset   .  Heart attack  Mother    .  Aneurysm  Father     Social History  History   Substance Use Topics   .  Smoking status:  Current Everyday Smoker -- 0.5 packs/day   .  Smokeless tobacco:  Never Used   .  Alcohol Use:  No    No Known Allergies   Current Outpatient Prescriptions   Medication  Sig  Dispense  Refill   .  aspirin 81 MG tablet  Take 81 mg by mouth daily.     Marland Kitchen  buPROPion (WELLBUTRIN XL) 150 MG 24 hr tablet      .  CALCIUM PO  Take by mouth daily.     .  fish oil-omega-3 fatty acids 1000 MG capsule  Take 2 g by mouth daily.     .  metoprolol succinate (TOPROL-XL) 25 MG 24 hr tablet  Take 25 mg by mouth daily.     .  rosuvastatin (CRESTOR) 10  MG tablet  Take 10 mg by mouth daily.      Review of Systems  Review of Systems  Constitutional: Negative for fever, chills and unexpected weight change.  HENT: Negative for hearing loss, congestion, sore throat, trouble swallowing and voice change.  Eyes: Negative for visual disturbance.  Respiratory: Negative for cough, shortness of breath and wheezing.  Cardiovascular: Negative for chest pain, palpitations and leg swelling.  Gastrointestinal: Negative for nausea, vomiting, abdominal pain, diarrhea, constipation, blood in stool, abdominal distention, anal bleeding and rectal pain.  Genitourinary: Negative for dysuria, urgency, hematuria, scrotal swelling, difficulty urinating, genital sores and testicular pain.  Musculoskeletal: Negative for arthralgias.  Skin: Negative for rash and wound.  Neurological: Negative for seizures, syncope, weakness and headaches.  Hematological: Negative for adenopathy. Does not bruise/bleed easily.  Psychiatric/Behavioral: Negative for confusion.     BP 106/67  Pulse 65  Temp(Src) 97.6 F (  36.4 C) (Oral)  Resp 20  Ht 5\' 7"  (1.702 m)  Wt 167 lb (75.751 kg)  BMI 26.16 kg/m2  SpO2 97% .  Physical Exam  Physical Exam  Vitals reviewed.  Constitutional: He is oriented to person, place, and time. He appears well-developed and well-nourished. No distress.  HENT:  Head: Normocephalic and atraumatic.  Eyes: Conjunctivae are normal. No scleral icterus.  Neck: Normal range of motion. Neck supple. No thyromegaly present.  Cardiovascular: Normal rate, regular rhythm and normal heart sounds.  Pulmonary/Chest: Effort normal and breath sounds normal. No respiratory distress. He has no wheezes.  Abdominal: Soft. Bowel sounds are normal. He exhibits no distension and no mass. There is no tenderness. Hernia confirmed negative in the right inguinal area and confirmed negative in the left inguinal area.  Genitourinary: Testes normal and penis normal.    Right  testis shows no mass. Left testis shows no mass.  4cm well circumscribed soft, subcu left suprapubic mass. Nontender, no fluctuance. No overlying skin changes.  Musculoskeletal: Normal range of motion.  Lymphadenopathy:  Right: No inguinal adenopathy present.  Left: No inguinal adenopathy present.  Neurological: He is alert and oriented to person, place, and time. He exhibits normal muscle tone.  Skin: Skin is warm and dry. No rash noted. No erythema.  Psychiatric: He has a normal mood and affect. His behavior is normal.   Data Reviewed  I reviewed my note from 10/30/2009 and 11/8 US PELVIS LIMITED OR FOLLOW UP  Comparison: None.  Findings: The patient has a well-circumscribed superficial mass in  the left groin region. The mass measures 3 cm x 2.5 cm by 2.6 cm.  The mass is well circumscribed and and heterogeneous in echotexture  but predominantly solid. There is no flow by color Doppler  ultrasound. On palpation the lesion and soft and protrudes beneath  the skin surface.  IMPRESSION:  Superficial soft tissue mass in the left groin the region has  benign characteristics being well circumscribed with no flow.  This may represent a complicated sebaceous cyst. That the mass is  solid, indeterminate and enlarging may warrant excision.  Assessment  Left suprapubic subcutaneous mass probable sebaceous cyst Plan   It does appear slightly larger today than it did in February. This appears most consistent with either a sebaceous cyst or small lipoma. This is not consistent with an inguinal hernia on exam. There are no scrotal or testicular masses.  The patient would like it surgically excised. I think this is reasonable.  We discussed the risk and benefits of surgery including but not limited to bleeding, infection, seroma formation, scarring, injury to surrounding structures, as well as the typical postoperative course. We also discussed the possibility of additional procedures should the  pathology abnormal.     [name]. Maurice Campanile, MD, FACS  General, Bariatric, & Minimally Invasive Surgery  [facility], [place]

## 2011-09-11 NOTE — Op Note (Signed)
BRENAN MODESTO Aug 31, 1961 MRN [id number]  Preop diagnosis: Left groin subcutaneous mass  Postop diagnosis: Left groin sebaceous cyst  Procedure: Excision of left groin sebaceous cyst with 2 layer closure  Surgeon: [name]. Andrey Campanile, M.D., FACS  Anesthesia: Monitored anesthesia care with 0.25% Marcaine with epi  Specimen: Left groin sebaceous cyst which was discarded  EBL: Minimal  Indications for procedure: Patient is a 50 year old Caucasian male who has had a prolonged history of a left groin subcutaneous mass. However over the past several months has been increasing in size and causing him some discomfort. On physical exam it is consistent with a cyst. However since it was enlarging in size we did get a scrotal ultrasound which confirmed that it was probably a complex cystic structure. The patient desired surgical excision. We discussed the risk and benefits of surgery including but not limited to bleeding, infection, injury to surrounding structures, hematoma formation, stromal formation, scarring, numbness and tingling in the area, and recurrence. We also discussed the aftercare.  Description of procedure: After obtaining informed consent and marking the appropriate site in the holding area, the patient was taken back to operating room 8 at [facility] and place upon on the operating room table. Sequential compression devices were placed. Monitored anesthesia care was established. His left groin was prepped and draped in the usual standard surgical fashion ChloraPrep. He received antibiotics prior to skin incision. A surgical timeout was performed.  Local was infiltrated around the cystic structure in the left groin. I then made a 2 cm oblique incision directly over the cystic mass. Cystic contents drained out. Although cystic wall was violated in one area, I was able to excise this cystic structure in its entirety by using a curved hemostat to bluntly separate the cystic wall from the  surrounding structures. Bovie electrocautery was used to free it in 2 areas. The cyst was placed on the back table. The wound was inspected. There was no remaining cystic wall within the cavity. The wound was irrigated with Betadine and saline. Additional local was infiltrated. I then closed the wound in 2 layers. The deep dermis was reapproximated with inverted interrupted 3-0 Vicryls. The skin was reapproximated by using a 4-0 Monocryl in a subcuticular fashion followed by the application of Dermabond. The patient was taken to recovery in stable condition. All needle and instrument counts were correct x2 there were no immediate complications. The patient tolerated the procedure well.  [name]. Andrey Campanile, MD, FACS General, Bariatric, & Minimally Invasive Surgery [facility], [place]

## 2011-09-11 NOTE — Progress Notes (Signed)
Pt became lightheaded upon sitting up to transfer from stretcher to recliner in Phase 2. Slumped and became weak-kneed without LOC. Placed back into stretcher, HOB lowered and IV fluids increased. Hooked back up to monitor. VSS. Pt feeling better very quickly. Given coke and crackers. Alert and oriented, talkative. Color pink. Says he "feels fine." will continue to monitor and assess in phase 2.

## 2011-09-12 ENCOUNTER — Encounter (HOSPITAL_BASED_OUTPATIENT_CLINIC_OR_DEPARTMENT_OTHER): Payer: Self-pay | Admitting: General Surgery

## 2011-09-17 ENCOUNTER — Encounter (INDEPENDENT_AMBULATORY_CARE_PROVIDER_SITE_OTHER): Payer: Self-pay | Admitting: General Surgery

## 2011-09-17 ENCOUNTER — Ambulatory Visit (INDEPENDENT_AMBULATORY_CARE_PROVIDER_SITE_OTHER): Payer: Managed Care, Other (non HMO) | Admitting: General Surgery

## 2011-09-17 VITALS — BP 124/86 | HR 84 | Temp 97.4°F | Resp 12 | Ht 67.0 in | Wt 166.6 lb

## 2011-09-17 DIAGNOSIS — Z09 Encounter for follow-up examination after completed treatment for conditions other than malignant neoplasm: Secondary | ICD-10-CM

## 2011-09-17 MED ORDER — HYDROCODONE-ACETAMINOPHEN 5-500 MG PO TABS: 2.0000 | ORAL_TABLET | Freq: Four times a day (QID) | ORAL | Status: AC | PRN

## 2011-09-17 NOTE — Patient Instructions (Signed)
You have developed a postoperative seroma/hematoma - collection of fluid It will go down with time.  Call if you develop temperature >101, worsening redness around incision or worsening pain

## 2011-09-17 NOTE — Progress Notes (Signed)
Chief complaint: Pain around the incision  Procedure: Status post excision of left groin sebaceous cyst December 13  History of Present Ilness: 50 year old Caucasian male comes in complaining of pain and swelling at his incision site. He denies any fevers or chills. He denies any nausea or vomiting. He denies any trouble urinating. He started having some swelling yesterday. It is nontender. He denies any drainage from the area.  Physical Exam: BP 124/86  Pulse 84  Temp(Src) 97.4 F (36.3 C) (Temporal)  Resp 12  Ht 5\' 7"  (1.702 m)  Wt 166 lb 9.6 oz (75.569 kg)  BMI 26.09 kg/m2  Gen: alert, NAD, non-toxic appearing Pulm: Lungs clear to auscultation, symmetric chest rise CV: regular rate and rhythm Abd: soft, nontender, nondistended.  GU: both testicles descended, no scrotal masses, L groin incision c/d/i. No cellulitis. Golf-ball sized seroma/hematoma. Mild tenderness over area. No fluctuance. Ext: no edema   Assessment and Plan: Status post excision of left groin sebaceous cyst with a postoperative seroma/hematoma   There is currently no sign of infection. He was given a prescription for Vicodin. He was given a note to keep him out of work for another week. He will keep his followup appointment with me for January 4.  We explained the management of postoperative seroma and hematomas.  [name]. Andrey Campanile, MD, FACS General, Bariatric, & Minimally Invasive Surgery [facility], [place]

## 2011-09-19 ENCOUNTER — Other Ambulatory Visit: Payer: Self-pay | Admitting: Cardiology

## 2011-10-03 ENCOUNTER — Ambulatory Visit (INDEPENDENT_AMBULATORY_CARE_PROVIDER_SITE_OTHER): Payer: Managed Care, Other (non HMO) | Admitting: General Surgery

## 2011-10-03 ENCOUNTER — Encounter (INDEPENDENT_AMBULATORY_CARE_PROVIDER_SITE_OTHER): Payer: Self-pay | Admitting: General Surgery

## 2011-10-03 VITALS — BP 122/88 | HR 68 | Temp 97.4°F | Resp 12 | Ht 67.0 in | Wt 168.6 lb

## 2011-10-03 DIAGNOSIS — Z09 Encounter for follow-up examination after completed treatment for conditions other than malignant neoplasm: Secondary | ICD-10-CM

## 2011-10-03 NOTE — Progress Notes (Signed)
Chief complaint: postop  Procedure: Status post excision of left groin sebaceous cyst December 13  History of Present Ilness: 51 year old Caucasian male comes in for follow-up. I last saw him before Christmas when he came in complaining of pain and swelling at his incision site. He was found to have a non-infected hematoma at his incision site. He denies any fevers or chills. He denies any nausea or vomiting. He denies any trouble urinating. The area is no longer swollen. It is nontender. He denies any drainage from the area.  Physical Exam: BP 122/88  Pulse 68  Temp(Src) 97.4 F (36.3 C) (Temporal)  Resp 12  Ht 5\' 7"  (1.702 m)  Wt 168 lb 9.6 oz (76.476 kg)  BMI 26.41 kg/m2  Gen: alert, NAD, non-toxic appearing Pulm: Lungs clear to auscultation, symmetric chest rise CV: regular rate and rhythm Abd: soft, nontender, nondistended.  GU: both testicles descended, no scrotal masses, L groin incision c/d/i. No cellulitis. Hematoma essentially resolved. No fluctuance. Ext: no edema   Assessment and Plan: Status post excision of left groin sebaceous cyst with a resolving postoperative seroma/hematoma   There is still currently no sign of infection. The area is healing nicely.  His FMLA papers have been completed and he has already returned to work. F/u prn  [name]. Andrey Campanile, MD, FACS General, Bariatric, & Minimally Invasive Surgery [facility], [place]

## 2012-01-16 ENCOUNTER — Other Ambulatory Visit: Payer: Self-pay | Admitting: *Deleted

## 2012-01-16 MED ORDER — ROSUVASTATIN CALCIUM 10 MG PO TABS: 10.0000 mg | ORAL_TABLET | Freq: Every day | ORAL | Status: DC

## 2012-01-16 MED ORDER — METOPROLOL SUCCINATE ER 25 MG PO TB24: 25.0000 mg | ORAL_TABLET | Freq: Every day | ORAL | Status: DC

## 2012-02-03 ENCOUNTER — Ambulatory Visit (INDEPENDENT_AMBULATORY_CARE_PROVIDER_SITE_OTHER): Payer: Managed Care, Other (non HMO) | Admitting: Cardiology

## 2012-02-03 ENCOUNTER — Encounter: Payer: Self-pay | Admitting: Cardiology

## 2012-02-03 VITALS — BP 124/86 | HR 73 | Ht 67.0 in | Wt 168.8 lb

## 2012-02-03 DIAGNOSIS — F17200 Nicotine dependence, unspecified, uncomplicated: Secondary | ICD-10-CM

## 2012-02-03 DIAGNOSIS — I2510 Atherosclerotic heart disease of native coronary artery without angina pectoris: Secondary | ICD-10-CM

## 2012-02-03 DIAGNOSIS — E7800 Pure hypercholesterolemia, unspecified: Secondary | ICD-10-CM

## 2012-02-03 DIAGNOSIS — D239 Other benign neoplasm of skin, unspecified: Secondary | ICD-10-CM

## 2012-02-03 DIAGNOSIS — D229 Melanocytic nevi, unspecified: Secondary | ICD-10-CM

## 2012-02-03 NOTE — Assessment & Plan Note (Signed)
Patient is currently stable.  Not having chest pain.  He does smoke unfortunately, and so remains at risk.  We discussed this today.

## 2012-02-03 NOTE — Progress Notes (Signed)
HPI:  He is in for follow up.  Overall he is doing quite well.  Unfortunately he continues to smoke at the present time. It does not seem that he has been exercising actively, partially related to the fact that he has a fairly hard labor job. He's not had labs checked in some time.  Current Outpatient Prescriptions  Medication Sig Dispense Refill  . aspirin 81 MG tablet Take 81 mg by mouth daily.        . fish oil-omega-3 fatty acids 1000 MG capsule Take 2 g by mouth daily.       . metoprolol succinate (TOPROL-XL) 25 MG 24 hr tablet Take 1 tablet (25 mg total) by mouth daily.  90 tablet  0  . NITROSTAT 0.4 MG SL tablet DISSOLVE 1 TAB UNDER TONGUE EVERY 5 MINUTES AS NEEDED FOR CHEST PAIN MAY REPEAT 3 TIMES  25 tablet  3  . PROAIR HFA 108 (90 BASE) MCG/ACT inhaler Ad lib.      . rosuvastatin (CRESTOR) 10 MG tablet Take 1 tablet (10 mg total) by mouth daily.  90 tablet  0  . HYDROcodone-acetaminophen (VICODIN) 5-500 MG per tablet Take 2 tablets by mouth every 6 (six) hours as needed for pain.  40 tablet  0    Allergies  Allergen Reactions  . Percocet (Oxycodone-Acetaminophen) Nausea And Vomiting    Past Medical History  Diagnosis Date  . Hypertension   . Hyperlipidemia   . History of heart attack     seen by Dr Riley Kill  . Myocardial infarction 2005-2006?  Marland Kitchen Coronary artery disease     Past Surgical History  Procedure Date  . Neck surgery 2009  . Cardiac catheterization 2008  . Elbow surgery     left elbow   . Bicep surgery      right bicep   . Shoulder arthroscopy   . Mass excision 09/11/2011    Procedure: EXCISION MASS;  Surgeon: Atilano Ina, MD;  Location: [facility];  Service: General;  Laterality: Left;  excision of left groin subcutaenous mass    Family History  Problem Relation Age of Onset  . Heart attack Mother   . Heart disease Mother   . Aneurysm Father   . Cancer Maternal Uncle     lung    History   Social History  . Marital Status:  Married    Spouse Name: N/A    Number of Children: N/A  . Years of Education: N/A   Occupational History  . Not on file.   Social History Main Topics  . Smoking status: Current Everyday Smoker -- 0.2 packs/day  . Smokeless tobacco: Never Used  . Alcohol Use: No  . Drug Use: No  . Sexually Active: Not on file   Other Topics Concern  . Not on file   Social History Narrative  . No narrative on file    ROS: Please see the HPI.  All other systems reviewed and negative.  PHYSICAL EXAM:  BP 124/86  Pulse 73  Ht 5\' 7"  (1.702 m)  Wt 168 lb 12.8 oz (76.567 kg)  BMI 26.44 kg/m2  General: Well developed, well nourished, in no acute distress. Head:  Normocephalic and atraumatic. Neck: no JVD Lungs: Clear to auscultation and percussion. Back:  Small mole, stable appearing, over left back.   Heart: Normal S1 and S2.  No murmur, rubs or gallops.  Abdomen:  Normal bowel sounds; soft; non tender; no organomegaly Pulses: Pulses normal  in all 4 extremities. Extremities: No clubbing or cyanosis. No edema. Neurologic: Alert and oriented x 3.  EKG:  NSR.  Nonspecific ST and T abnormality.    ASSESSMENT AND PLAN:

## 2012-02-03 NOTE — Assessment & Plan Note (Signed)
Needs to have a lipid and liver checked today.

## 2012-02-03 NOTE — Assessment & Plan Note (Signed)
Reviewed with patient today

## 2012-02-03 NOTE — Assessment & Plan Note (Signed)
Does not appear to have high risk features  (irregular border, variability of color).  However, I suggested that he see a dermatologist to assess.  Patient acknowledged.

## 2012-02-03 NOTE — Patient Instructions (Signed)
Your physician recommends that you return for a FASTING LIPID and LIVER profile--nothing to eat or drink after midnight, lab opens at 8:30  Your physician wants you to follow-up in: 1 YEAR with Dr Excell Seltzer.  You will receive a reminder letter in the mail two months in advance. If you don't receive a letter, please call our office to schedule the follow-up appointment.  Your physician recommends that you continue on your current medications as directed. Please refer to the Current Medication list given to you today.

## 2012-02-09 ENCOUNTER — Other Ambulatory Visit (INDEPENDENT_AMBULATORY_CARE_PROVIDER_SITE_OTHER): Payer: Managed Care, Other (non HMO)

## 2012-02-09 DIAGNOSIS — I2510 Atherosclerotic heart disease of native coronary artery without angina pectoris: Secondary | ICD-10-CM

## 2012-02-09 DIAGNOSIS — E7800 Pure hypercholesterolemia, unspecified: Secondary | ICD-10-CM

## 2012-02-09 LAB — LIPID PANEL
Cholesterol: 117 mg/dL (ref 0–200)
Total CHOL/HDL Ratio: 3
Triglycerides: 65 mg/dL (ref 0.0–149.0)

## 2012-02-09 LAB — HEPATIC FUNCTION PANEL
AST: 19 U/L (ref 0–37)
Albumin: 3.9 g/dL (ref 3.5–5.2)
Alkaline Phosphatase: 62 U/L (ref 39–117)
Bilirubin, Direct: 0 mg/dL (ref 0.0–0.3)

## 2012-02-11 ENCOUNTER — Encounter: Payer: Self-pay | Admitting: Cardiology

## 2012-02-11 NOTE — Telephone Encounter (Signed)
Pt calling for lab results.

## 2012-02-11 NOTE — Telephone Encounter (Signed)
This encounter was created in error - please disregard.

## 2012-04-23 ENCOUNTER — Other Ambulatory Visit: Payer: Self-pay | Admitting: *Deleted

## 2012-04-23 MED ORDER — ROSUVASTATIN CALCIUM 10 MG PO TABS: 10.0000 mg | ORAL_TABLET | Freq: Every day | ORAL | Status: DC

## 2012-04-26 ENCOUNTER — Other Ambulatory Visit: Payer: Self-pay | Admitting: Cardiology

## 2012-04-26 MED ORDER — METOPROLOL SUCCINATE ER 25 MG PO TB24: 25.0000 mg | ORAL_TABLET | Freq: Every day | ORAL | Status: DC

## 2012-04-26 NOTE — Telephone Encounter (Signed)
Out of pills 

## 2012-04-27 ENCOUNTER — Telehealth: Payer: Self-pay | Admitting: Cardiology

## 2012-04-27 MED ORDER — METOPROLOL SUCCINATE ER 25 MG PO TB24: 25.0000 mg | ORAL_TABLET | Freq: Every day | ORAL | Status: DC

## 2012-04-27 MED ORDER — ROSUVASTATIN CALCIUM 10 MG PO TABS: 10.0000 mg | ORAL_TABLET | Freq: Every day | ORAL | Status: DC

## 2012-04-27 NOTE — Telephone Encounter (Signed)
Spoke to patient's wife was told left samples of crestor 10 mg left at front desk 3 rd floor.Toprol and crestor sent to [employer] order.

## 2012-04-27 NOTE — Telephone Encounter (Addendum)
Pt wife called yesterday to follow up on  refill sent in to mail company to get crestor and toprolol that was suppose to be sent in on 7/21. Pt is completely out of meds and would like [place] to provide some samples until he can get his medication from the company. They need a call today so they will know what to do.

## 2012-08-09 ENCOUNTER — Telehealth: Payer: Self-pay | Admitting: Cardiology

## 2012-08-09 NOTE — Telephone Encounter (Signed)
New problem:    Patient  having a Cardiac Mri done on  11/13.  by Dr. Eulah Pont   Office Wants  to verify is the stent safe to have procedure.  Fax # [phone]. Attention MRI dept.

## 2012-08-10 NOTE — Telephone Encounter (Signed)
Pt's wife aware that the pt can proceed with MRI per Dr Riley Kill

## 2012-08-10 NOTE — Telephone Encounter (Signed)
Per Dr Riley Kill this pt is okay to proceed with MRI from a cardiac standpoint. I will fax this note to the MRI dept.

## 2012-09-20 ENCOUNTER — Encounter (HOSPITAL_BASED_OUTPATIENT_CLINIC_OR_DEPARTMENT_OTHER): Payer: Self-pay | Admitting: *Deleted

## 2012-09-20 ENCOUNTER — Encounter (HOSPITAL_BASED_OUTPATIENT_CLINIC_OR_DEPARTMENT_OTHER)
Admission: RE | Admit: 2012-09-20 | Discharge: 2012-09-20 | Disposition: A | Discharge status: Home / Self Care | Payer: Managed Care, Other (non HMO) | Source: Ambulatory Visit | Attending: Orthopedic Surgery | Admitting: Orthopedic Surgery

## 2012-09-20 LAB — BASIC METABOLIC PANEL
BUN: 11 mg/dL (ref 6–23)
Calcium: 9.3 mg/dL (ref 8.4–10.5)
Creatinine, Ser: 0.88 mg/dL (ref 0.50–1.35)
GFR calc Af Amer: >90 mL/min (ref >90)

## 2012-09-20 NOTE — Progress Notes (Signed)
To come in for bmet-had ekg dr Riley Kill 5/13 Pt here [facility] 12/12 for cysr removal Had cardiac clearance for cervical surgery 2010-and normal studies 08 No anesth problems

## 2012-09-24 ENCOUNTER — Other Ambulatory Visit: Payer: Self-pay | Admitting: Orthopedic Surgery

## 2012-09-27 ENCOUNTER — Encounter (HOSPITAL_BASED_OUTPATIENT_CLINIC_OR_DEPARTMENT_OTHER): Payer: Self-pay | Admitting: Anesthesiology

## 2012-09-27 ENCOUNTER — Encounter (HOSPITAL_BASED_OUTPATIENT_CLINIC_OR_DEPARTMENT_OTHER)
Admission: RE | Disposition: A | Discharge status: Home / Self Care | Payer: Self-pay | Source: Ambulatory Visit | Attending: Orthopedic Surgery

## 2012-09-27 ENCOUNTER — Ambulatory Visit (HOSPITAL_BASED_OUTPATIENT_CLINIC_OR_DEPARTMENT_OTHER)
Admission: RE | Admit: 2012-09-27 | Discharge: 2012-09-27 | Disposition: A | Discharge status: Home / Self Care | Payer: Managed Care, Other (non HMO) | Source: Ambulatory Visit | Attending: Orthopedic Surgery | Admitting: Orthopedic Surgery

## 2012-09-27 ENCOUNTER — Encounter (HOSPITAL_BASED_OUTPATIENT_CLINIC_OR_DEPARTMENT_OTHER): Payer: Self-pay | Admitting: Orthopedic Surgery

## 2012-09-27 ENCOUNTER — Ambulatory Visit (HOSPITAL_BASED_OUTPATIENT_CLINIC_OR_DEPARTMENT_OTHER): Payer: Managed Care, Other (non HMO) | Admitting: Anesthesiology

## 2012-09-27 DIAGNOSIS — I2510 Atherosclerotic heart disease of native coronary artery without angina pectoris: Secondary | ICD-10-CM | POA: Insufficient documentation

## 2012-09-27 DIAGNOSIS — Z01812 Encounter for preprocedural laboratory examination: Secondary | ICD-10-CM | POA: Insufficient documentation

## 2012-09-27 DIAGNOSIS — Z8249 Family history of ischemic heart disease and other diseases of the circulatory system: Secondary | ICD-10-CM | POA: Insufficient documentation

## 2012-09-27 DIAGNOSIS — M249 Joint derangement, unspecified: Secondary | ICD-10-CM | POA: Insufficient documentation

## 2012-09-27 DIAGNOSIS — E785 Hyperlipidemia, unspecified: Secondary | ICD-10-CM | POA: Insufficient documentation

## 2012-09-27 DIAGNOSIS — I252 Old myocardial infarction: Secondary | ICD-10-CM | POA: Insufficient documentation

## 2012-09-27 DIAGNOSIS — Z885 Allergy status to narcotic agent status: Secondary | ICD-10-CM | POA: Insufficient documentation

## 2012-09-27 DIAGNOSIS — I10 Essential (primary) hypertension: Secondary | ICD-10-CM | POA: Insufficient documentation

## 2012-09-27 DIAGNOSIS — M129 Arthropathy, unspecified: Secondary | ICD-10-CM | POA: Insufficient documentation

## 2012-09-27 HISTORY — PX: WRIST ARTHROSCOPY WITH DEBRIDEMENT: SHX6194

## 2012-09-27 HISTORY — DX: Unspecified osteoarthritis, unspecified site: M19.90

## 2012-09-27 LAB — POCT HEMOGLOBIN-HEMACUE: Hemoglobin: 15.5 g/dL (ref 13.0–17.0)

## 2012-09-27 SURGERY — WRIST ARTHROSCOPY WITH DEBRIDEMENT
Anesthesia: Regional | Site: Wrist | Laterality: Left | Wound class: Clean

## 2012-09-27 MED ORDER — MEPERIDINE HCL 25 MG/ML IJ SOLN: 6.2500 mg | INTRAMUSCULAR | Status: DC | PRN

## 2012-09-27 MED ORDER — OXYCODONE HCL 5 MG PO TABS: 5.0000 mg | ORAL_TABLET | Freq: Once | ORAL | Status: DC | PRN

## 2012-09-27 MED ORDER — FENTANYL CITRATE 0.05 MG/ML IJ SOLN
100.0000 ug | Freq: Once | INTRAMUSCULAR | Status: AC
  Administered 2012-09-27: 100 ug via INTRAVENOUS

## 2012-09-27 MED ORDER — OXYCODONE HCL 5 MG/5ML PO SOLN: 5.0000 mg | Freq: Once | ORAL | Status: DC | PRN

## 2012-09-27 MED ORDER — DEXAMETHASONE SODIUM PHOSPHATE 4 MG/ML IJ SOLN
INTRAMUSCULAR | Status: DC | PRN
  Administered 2012-09-27: 10 mg via INTRAVENOUS

## 2012-09-27 MED ORDER — LACTATED RINGERS IV SOLN
INTRAVENOUS | Status: DC
  Administered 2012-09-27 (×2): via INTRAVENOUS

## 2012-09-27 MED ORDER — CEFAZOLIN SODIUM-DEXTROSE 2-3 GM-% IV SOLR
2.0000 g | INTRAVENOUS | Status: AC
  Administered 2012-09-27: 2 g via INTRAVENOUS

## 2012-09-27 MED ORDER — CHLORHEXIDINE GLUCONATE 4 % EX LIQD: 60.0000 mL | Freq: Once | CUTANEOUS | Status: DC

## 2012-09-27 MED ORDER — PENTAZOCINE-NALOXONE 50-0.5 MG PO TABS: 1.0000 | ORAL_TABLET | ORAL | Status: DC | PRN

## 2012-09-27 MED ORDER — PROPOFOL 10 MG/ML IV BOLUS: INTRAVENOUS | Status: DC | PRN

## 2012-09-27 MED ORDER — PROMETHAZINE HCL 25 MG/ML IJ SOLN: 6.2500 mg | INTRAMUSCULAR | Status: DC | PRN

## 2012-09-27 MED ORDER — ROPIVACAINE HCL 5 MG/ML IJ SOLN
INTRAMUSCULAR | Status: DC | PRN
  Administered 2012-09-27: 30 mL

## 2012-09-27 MED ORDER — LIDOCAINE HCL (CARDIAC) 10 MG/ML IV SOLN
INTRAVENOUS | Status: DC | PRN
  Administered 2012-09-27: 100 mg via INTRAVENOUS

## 2012-09-27 MED ORDER — HYDROMORPHONE HCL PF 1 MG/ML IJ SOLN: 0.2500 mg | INTRAMUSCULAR | Status: DC | PRN

## 2012-09-27 MED ORDER — SUCCINYLCHOLINE CHLORIDE 20 MG/ML IJ SOLN
INTRAMUSCULAR | Status: DC | PRN
  Administered 2012-09-27: 100 mg via INTRAVENOUS

## 2012-09-27 MED ORDER — ONDANSETRON HCL 4 MG/2ML IJ SOLN
INTRAMUSCULAR | Status: DC | PRN
  Administered 2012-09-27: 4 mg via INTRAVENOUS

## 2012-09-27 MED ORDER — PROPOFOL 10 MG/ML IV BOLUS
INTRAVENOUS | Status: DC | PRN
  Administered 2012-09-27: 30 mg via INTRAVENOUS
  Administered 2012-09-27: 170 mg via INTRAVENOUS

## 2012-09-27 MED ORDER — MIDAZOLAM HCL 2 MG/2ML IJ SOLN
1.0000 mg | INTRAMUSCULAR | Status: DC | PRN
  Administered 2012-09-27: 2 mg via INTRAVENOUS

## 2012-09-27 SURGICAL SUPPLY — 75 items
BANDAGE GAUZE ELAST BULKY 4 IN (GAUZE/BANDAGES/DRESSINGS) ×2 IMPLANT
BLADE CUDA 2.0 (BLADE) IMPLANT
BLADE EAR TYMPAN 2.5 60D BEAV (BLADE) IMPLANT
BLADE MINI RND TIP GREEN BEAV (BLADE) IMPLANT
BLADE SURG 15 STRL LF DISP TIS (BLADE) ×1 IMPLANT
BLADE SURG 15 STRL SS (BLADE) ×1
BNDG COHESIVE 3X5 TAN STRL LF (GAUZE/BANDAGES/DRESSINGS) ×2 IMPLANT
BNDG ESMARK 4X9 LF (GAUZE/BANDAGES/DRESSINGS) IMPLANT
BUR CUDA 2.9 (BURR) IMPLANT
BUR FULL RADIUS 2.0 (BURR) IMPLANT
BUR FULL RADIUS 2.9 (BURR) ×2 IMPLANT
BUR GATOR 2.9 (BURR) IMPLANT
BUR SPHERICAL 2.9 (BURR) IMPLANT
CANISTER OMNI JUG 16 LITER (MISCELLANEOUS) IMPLANT
CANISTER SUCTION 1200CC (MISCELLANEOUS) IMPLANT
CANISTER SUCTION 2500CC (MISCELLANEOUS) ×2 IMPLANT
CHLORAPREP W/TINT 26ML (MISCELLANEOUS) ×2 IMPLANT
CLOTH BEACON ORANGE TIMEOUT ST (SAFETY) ×2 IMPLANT
CORDS BIPOLAR (ELECTRODE) IMPLANT
COVER MAYO STAND STRL (DRAPES) ×4 IMPLANT
COVER TABLE BACK 60X90 (DRAPES) ×2 IMPLANT
CUFF TOURNIQUET SINGLE 18IN (TOURNIQUET CUFF) ×2 IMPLANT
DRAPE EXTREMITY T 121X128X90 (DRAPE) ×2 IMPLANT
DRAPE OEC MINIVIEW 54X84 (DRAPES) IMPLANT
DRAPE SURG 17X23 STRL (DRAPES) ×2 IMPLANT
DRSG KUZMA FLUFF (GAUZE/BANDAGES/DRESSINGS) IMPLANT
ELECT SMALL JOINT 90D BASC (ELECTRODE) ×2 IMPLANT
GAUZE XEROFORM 1X8 LF (GAUZE/BANDAGES/DRESSINGS) ×2 IMPLANT
GLOVE BIO SURGEON STRL SZ 6.5 (GLOVE) ×2 IMPLANT
GLOVE BIOGEL M STRL SZ7.5 (GLOVE) ×2 IMPLANT
GLOVE BIOGEL PI IND STRL 7.0 (GLOVE) ×1 IMPLANT
GLOVE BIOGEL PI IND STRL 8.5 (GLOVE) ×1 IMPLANT
GLOVE BIOGEL PI INDICATOR 7.0 (GLOVE) ×1
GLOVE BIOGEL PI INDICATOR 8.5 (GLOVE) ×1
GLOVE SURG ORTHO 8.0 STRL STRW (GLOVE) ×2 IMPLANT
GOWN BRE IMP PREV XXLGXLNG (GOWN DISPOSABLE) ×4 IMPLANT
GOWN PREVENTION PLUS XLARGE (GOWN DISPOSABLE) ×2 IMPLANT
IV NS IRRIG 3000ML ARTHROMATIC (IV SOLUTION) ×2 IMPLANT
NDL SAFETY ECLIPSE 18X1.5 (NEEDLE) ×3 IMPLANT
NEEDLE HYPO 18GX1.5 SHARP (NEEDLE) ×3
NEEDLE HYPO 22GX1.5 SAFETY (NEEDLE) ×2 IMPLANT
NEEDLE SPNL 18GX3.5 QUINCKE PK (NEEDLE) IMPLANT
NEEDLE TUOHY 20GX3.5 (NEEDLE) IMPLANT
NS IRRIG 1000ML POUR BTL (IV SOLUTION) IMPLANT
PACK BASIN DAY SURGERY FS (CUSTOM PROCEDURE TRAY) ×2 IMPLANT
PAD CAST 3X4 CTTN HI CHSV (CAST SUPPLIES) ×1 IMPLANT
PADDING CAST ABS 3INX4YD NS (CAST SUPPLIES) ×1
PADDING CAST ABS 4INX4YD NS (CAST SUPPLIES) ×1
PADDING CAST ABS COTTON 3X4 (CAST SUPPLIES) ×1 IMPLANT
PADDING CAST ABS COTTON 4X4 ST (CAST SUPPLIES) ×1 IMPLANT
PADDING CAST COTTON 3X4 STRL (CAST SUPPLIES) ×1
ROUTER HOODED VORTEX 2.9MM (BLADE) IMPLANT
SET ARTHROSCOPY TUBING (MISCELLANEOUS) ×1
SET ARTHROSCOPY TUBING LN (MISCELLANEOUS) ×1 IMPLANT
SET EXT MALE ROTATING LL 32IN (MISCELLANEOUS) ×2 IMPLANT
SET SM JOINT TUBING/CANN (CANNULA) IMPLANT
SLEEVE SCD COMPRESS KNEE MED (MISCELLANEOUS) ×2 IMPLANT
SPLINT PLASTER CAST XFAST 3X15 (CAST SUPPLIES) ×10 IMPLANT
SPLINT PLASTER XTRA FASTSET 3X (CAST SUPPLIES) ×10
SPONGE GAUZE 4X4 12PLY (GAUZE/BANDAGES/DRESSINGS) ×2 IMPLANT
STOCKINETTE 4X48 STRL (DRAPES) ×2 IMPLANT
SUCTION FRAZIER TIP 10 FR DISP (SUCTIONS) IMPLANT
SUT MERSILENE 4 0 P 3 (SUTURE) IMPLANT
SUT PDS AB 2-0 CT2 27 (SUTURE) IMPLANT
SUT STEEL 4 0 (SUTURE) IMPLANT
SUT VIC AB 2-0 PS2 27 (SUTURE) IMPLANT
SUT VICRYL 4-0 PS2 18IN ABS (SUTURE) IMPLANT
SUT VICRYL RAPID 5 0 P 3 (SUTURE) IMPLANT
SUT VICRYL RAPIDE 4/0 PS 2 (SUTURE) ×2 IMPLANT
SYR BULB 3OZ (MISCELLANEOUS) ×2 IMPLANT
SYR CONTROL 10ML LL (SYRINGE) ×2 IMPLANT
TUBE CONNECTING 20X1/4 (TUBING) ×2 IMPLANT
UNDERPAD 30X30 INCONTINENT (UNDERPADS AND DIAPERS) ×2 IMPLANT
WAND 1.5 MICROBLATOR (SURGICAL WAND) IMPLANT
WATER STERILE IRR 1000ML POUR (IV SOLUTION) ×2 IMPLANT

## 2012-09-27 NOTE — Op Note (Signed)
Dictation Number [id number]

## 2012-09-27 NOTE — Anesthesia Procedure Notes (Addendum)
Anesthesia Regional Block:  Supraclavicular block  Pre-Anesthetic Checklist: ,, timeout performed, Correct Patient, Correct Site, Correct Laterality, Correct Procedure, Correct Position, site marked, Risks and benefits discussed, Surgical consent,  At surgeon's request and post-op pain management  Laterality: Left and Upper  Prep: chloraprep       Needles:   Needle Type: Echogenic Needle        Needle insertion depth: 3 cm   Additional Needles:  Procedures: ultrasound guided (picture in chart) Supraclavicular block Narrative:  Start time: 09/27/2012 1:40 PM End time: 09/27/2012 1:56 PM Injection made incrementally with aspirations every 5 mL.  Performed by: Personally  Anesthesiologist: T Massagee  Additional Notes: Tolerated well. But had a cough during procedure. Did not appear needle was near pleura or other.   Procedure Name: Intubation Date/Time: 09/27/2012 2:20 PM Performed by: Gar Gibbon Pre-anesthesia Checklist: Patient identified, Emergency Drugs available, Suction available and Patient being monitored Patient Re-evaluated:Patient Re-evaluated prior to inductionOxygen Delivery Method: Circle System Utilized Preoxygenation: Pre-oxygenation with 100% oxygen Intubation Type: IV induction Ventilation: Mask ventilation without difficulty Laryngoscope Size: Mac and 3 Grade View: Grade II Tube type: Oral Tube size: 8.0 mm Number of attempts: 1 Airway Equipment and Method: stylet and oral airway Placement Confirmation: ETT inserted through vocal cords under direct vision,  positive ETCO2 and breath sounds checked- equal and bilateral Secured at: 21 cm Tube secured with: Tape Dental Injury: Teeth and Oropharynx as per pre-operative assessment

## 2012-09-27 NOTE — Anesthesia Preprocedure Evaluation (Signed)
Anesthesia Evaluation  Patient identified by MRN, date of birth, ID band Patient awake    Reviewed: Allergy & Precautions, NPO status , Patient's Chart, lab work & pertinent test results  History of Anesthesia Complications Negative for: history of anesthetic complications  Airway Mallampati: I      Dental  (+) Teeth Intact   Pulmonary neg pulmonary ROS,  breath sounds clear to auscultation        Cardiovascular hypertension, + CAD and + Past MI Rate:Normal     Neuro/Psych negative neurological ROS     GI/Hepatic negative GI ROS, Neg liver ROS,   Endo/Other  negative endocrine ROS  Renal/GU negative Renal ROS     Musculoskeletal   Abdominal   Peds  Hematology   Anesthesia Other Findings   Reproductive/Obstetrics                           Anesthesia Physical Anesthesia Plan  ASA: III  Anesthesia Plan: General   Post-op Pain Management:    Induction: Intravenous  Airway Management Planned: Oral ETT  Additional Equipment:   Intra-op Plan:   Post-operative Plan: Extubation in OR  Informed Consent: I have reviewed the patients History and Physical, chart, labs and discussed the procedure including the risks, benefits and alternatives for the proposed anesthesia with the patient or authorized representative who has indicated his/her understanding and acceptance.   Dental advisory given  Plan Discussed with: CRNA and Surgeon  Anesthesia Plan Comments:         Anesthesia Quick Evaluation

## 2012-09-27 NOTE — H&P (Signed)
[name] is a 51 year old right hand dominant male referred by Dr. Eulah Pont for a consultation with respect to pain in his left wrist. This is on the ulnar aspect. It has been present for 5-6 months. He recalls no specific history of injury. He did play baseball in high school. He states it just started hurting and has gotten worse. He complains of intermittent severe, sharp pain in the ulnar aspect. Activity, exercise and work makes this worse. He has been taking Tylenol and wearing a brace. No history of diabetes, thyroid problems, arthritis or gout. He has a history of biceps repair on his right side, a surgery on his left elbow, and a disc in his neck treated by Dr. Newell Coral, and a rotator cuff repair.  PAST MEDICAL HISTORY: He has no known drug allergies. He is on Crestor, fish oil, and aspirin. He has had the right elbow, left shoulder, neck, left forearm surgery.  FAMILY H ISTORY: Positive for heart disease and high BP.  SOCIAL HISTORY: He smokes 1/2 PPD and is advised to quit and the reasons behind this. He does not drink. He is a Writer at [employer].   REVIEW OF SYSTEMS: Positive for heart attack, otherwise negative for 14 points.  [name] is an 51 y.o. male.   Chief Complaint: Ligament injury left wrisr HPI: see above  Past Medical History  Diagnosis Date  . Hypertension   . Hyperlipidemia   . History of heart attack     seen by Dr Riley Kill  . Coronary artery disease   . Myocardial infarction 2005-2006?    stent 07  . Arthritis     Past Surgical History  Procedure Date  . Neck surgery 2009  . Elbow surgery     left elbow   . Bicep surgery      right bicep   . Shoulder arthroscopy   . Mass excision 09/11/2011    Procedure: EXCISION MASS;  Surgeon: Atilano Ina, MD;  [facility];  Service: General;  Laterality: Left;  excision of left groin subcutaenous mass  . Cardiac catheterization 2008,07    Family History  Problem Relation Age of  Onset  . Heart attack Mother   . Heart disease Mother   . Aneurysm Father   . Cancer Maternal Uncle     lung   Social History:  reports that he has been smoking.  He has never used smokeless tobacco. He reports that he does not drink alcohol or use illicit drugs.  Allergies:  Allergies  Allergen Reactions  . Percocet (Oxycodone-Acetaminophen) Nausea And Vomiting    No prescriptions prior to admission    No results found for this or any previous visit (from the past 48 hour(s)).  No results found.   Pertinent items are noted in HPI.  Height 5\' 7"  (1.702 m), weight 76.204 kg (168 lb).  General appearance: alert, cooperative and appears stated age Head: Normocephalic, without obvious abnormality Neck: no adenopathy Resp: clear to auscultation bilaterally Cardio: regular rate and rhythm, S1, S2 normal, no murmur, click, rub or gallop GI: soft, non-tender; bowel sounds normal; no masses,  no organomegaly Extremities: extremities normal, atraumatic, no cyanosis or edema Pulses: 2+ and symmetric Skin: Skin color, texture, turgor normal. No rashes or lesions Neurologic: Grossly normal Incision/Wound: na  Assessment/Plan X-rays reveal a defect in the ulnar proximal aspect of his lunate. He does not have a long ulna.   He has had an MRI performed and  this reveals a defect on the volar ulnar aspect of his lunate. My experience with these is that this has been an avulsion of the ulnar lunate ligament from his lunate. There may be a lunotriquetral tear associated with this. He does not appear to have a TFCC tear.   We have had a very long thorough discussion with respect to this and there is no way to guarantee recovery. He does show supination of his hand on his distal radius indicative of snagging of the ulnar aspect. We are attempting to treat pain for him in that the general consensus for this has been to dbride these. We discussed the possibility of an ulnar four bone fusion. He  does have a slight sag on the ulnar aspect. We discussed the possibility of an open procedure. We will plan on arthroscopic inspection, debridement. If there is significant injury then we will consider this a diagnostic scope and discuss reconstruction. He would like to proceed. He is scheduled for left wrist arthroscopy as an outpatient.  Sampson Self R 09/27/2012, 11:45 AM

## 2012-09-27 NOTE — Progress Notes (Signed)
Assisted Dr. Massagee with left, ultrasound guided, supraclavicular block. Side rails up, monitors on throughout procedure. See vital signs in flow sheet. Tolerated Procedure well. 

## 2012-09-27 NOTE — Anesthesia Postprocedure Evaluation (Signed)
  Anesthesia Post-op Note  Patient: [name]  Procedure(s) Performed: Procedure(s) (LRB) with comments: WRIST ARTHROSCOPY WITH DEBRIDEMENT (Left) - LEFT WRIST ARTHROSCOPY DEBRIDEMENT  SHRINKAGE TRIANGULAR FIBROCARTILAGE COMPLEX/ LIGAMENT   Patient Location: PACU  Anesthesia Type:GA combined with regional for post-op pain  Level of Consciousness: awake  Airway and Oxygen Therapy: Patient Spontanous Breathing  Post-op Pain: none  Post-op Assessment: Post-op Vital signs reviewed, Patient's Cardiovascular Status Stable and Respiratory Function Stable  Post-op Vital Signs: stable  Complications: No apparent anesthesia complications

## 2012-09-27 NOTE — Brief Op Note (Signed)
09/27/2012  3:11 PM  PATIENT:  [name]  51 y.o. male  PRE-OPERATIVE DIAGNOSIS:  LIGAMENT TEAR LEFT WRIST   POST-OPERATIVE DIAGNOSIS:  ligament tear left wrist  PROCEDURE:  Procedure(s) (LRB) with comments: WRIST ARTHROSCOPY WITH DEBRIDEMENT (Left) - LEFT WRIST ARTHROSCOPY DEBRIDEMENT  SHRINKAGE TRIANGULAR FIBROCARTILAGE COMPLEX/ LIGAMENT   SURGEON:  Surgeon(s) and Role:    * Nicki Reaper, MD - Primary  PHYSICIAN ASSISTANT:   ASSISTANTS: Annye Rusk, PAC   ANESTHESIA:   regional and general  EBL:  Total I/O In: 1000 [I.V.:1000] Out: -   BLOOD ADMINISTERED:none  DRAINS: none   LOCAL MEDICATIONS USED:  NONE  SPECIMEN:  No Specimen  DISPOSITION OF SPECIMEN:  N/A  COUNTS:  YES  TOURNIQUET:  * Missing tourniquet times found for documented tourniquets in log:  [id number] *  DICTATION: .Other Dictation: Dictation Number [id number]  PLAN OF CARE: Discharge to home after PACU  PATIENT DISPOSITION:  PACU - hemodynamically stable.

## 2012-09-27 NOTE — Transfer of Care (Signed)
Immediate Anesthesia Transfer of Care Note  Patient: [name]  Procedure(s) Performed: Procedure(s) (LRB) with comments: WRIST ARTHROSCOPY WITH DEBRIDEMENT (Left) - LEFT WRIST ARTHROSCOPY DEBRIDEMENT  SHRINKAGE TRIANGULAR FIBROCARTILAGE COMPLEX/ LIGAMENT   Patient Location: PACU  Anesthesia Type:GA combined with regional for post-op pain  Level of Consciousness: awake, alert  and oriented  Airway & Oxygen Therapy: Patient Spontanous Breathing and Patient connected to face mask oxygen  Post-op Assessment: Report given to PACU RN and Post -op Vital signs reviewed and stable  Post vital signs: Reviewed and stable  Complications: No apparent anesthesia complications

## 2012-09-28 NOTE — Op Note (Signed)
NAMEXANDER, JUTRAS NO.:  [id number]  MEDICAL RECORD NO.:  [id number]  LOCATION:                                 FACILITY:  PHYSICIAN:  Cindee Salt, M.D.            DATE OF BIRTH:  DATE OF PROCEDURE:  09/27/2012 DATE OF DISCHARGE:                              OPERATIVE REPORT   PREOPERATIVE DIAGNOSIS:  Avulsion volar ligaments, lunate to ulna  left wrist.  POSTOPERATIVE DIAGNOSIS:  Avulsion volar ligaments, lunate to ulna left wrist.  OPERATION:  Arthroscopy of left wrist with debridement, shrinkage of ulnolunate ligaments with debridement.  SURGEON:  Cindee Salt, MD  ASSISTANT:  Jonni Sanger, PA  ANESTHESIA:  Supraclavicular block general.  ANESTHESIOLOGIST:  Burna Forts, MD  HISTORY:  The patient is a 51 year old male with history of wrist pain. MRI reveals that he has an avulsion of volar ulnar ligament to the lunate.  He is admitted with possible debridement TFCC, debridement of the ligament, possible shrinkage.  Pre, peri, and postoperative course have been discussed along with risks and complications.  He is aware there is no guarantee with the surgery; possibility of infection; recurrence of injury to arteries, nerves, tendons, incomplete relief of symptoms, dystrophy.  In the preoperative area, the patient is seen, the extremity marked by both patient and surgeon.  Antibiotic given.  PROCEDURE:  The patient was brought to the operating room where a supraclavicular block was carried out without difficulty.  A general anesthetic also given.  He was prepped using ChloraPrep, supine position with the left arm free.  A 3-minute dry time was allowed, time-out taken, confirming patient procedure, was placed in the arthroscopy tower, 10 pounds traction applied.  The joint was inflated 3/4 portal. Transverse incision made through skin only, deepened with a hemostat. Blunt trocar was used to enter the joint.  Joint was inspected.  The volar  radial wrist ligaments were intact.  Some fraying of the long radiolunate ligament was noted.  The scapholunate was intact.  The cartilage showed no significant degenerative changes.  The TFCC was patulous with a small tear centrally.  A volar ulnar ligament was folded into the joint.  This was probed after placement of an irrigation catheter in __________ and a 4/5 portal opened, localized with 22-gauge needle.  Transverse incision made, deepened with a hemostat.  Blunt trocar was used to enter the joint.  A debridement was then performed, alternating the scope to 3/4, 4/5 portal and the instruments in the opposite portal.  A debridement shrinkage was then performed.  Removal of the torn ends of the ligament which were diluted into the joint was performed.  The midcarpal joint was inspected.  There was no instability of lunotriquetral, scapholunate.  Cartilage surfaces brought proximally and distally showed good articular surface along with the STT joint. The instruments were removed.  Portals closed with interrupted 4-0 Vicryl Rapide sutures.  Sterile compressive dorsal palmar splint applied.  Tourniquet was not inflated through the procedure.  He tolerated the procedure well and was taken to the recovery room for observation in satisfactory condition.  ______________________________ Cindee Salt, M.D.     GK/MEDQ  D:  09/27/2012  T:  09/28/2012  Job:  [id number]

## 2012-09-30 ENCOUNTER — Encounter (HOSPITAL_BASED_OUTPATIENT_CLINIC_OR_DEPARTMENT_OTHER): Payer: Self-pay | Admitting: Orthopedic Surgery

## 2013-02-07 ENCOUNTER — Ambulatory Visit (INDEPENDENT_AMBULATORY_CARE_PROVIDER_SITE_OTHER): Payer: Managed Care, Other (non HMO) | Admitting: Cardiovascular Disease

## 2013-02-07 ENCOUNTER — Encounter: Payer: Self-pay | Admitting: Cardiovascular Disease

## 2013-02-07 VITALS — BP 110/84 | HR 70 | Wt 175.0 lb

## 2013-02-07 DIAGNOSIS — I2510 Atherosclerotic heart disease of native coronary artery without angina pectoris: Secondary | ICD-10-CM

## 2013-02-07 DIAGNOSIS — E7800 Pure hypercholesterolemia, unspecified: Secondary | ICD-10-CM

## 2013-02-07 MED ORDER — NITROGLYCERIN 0.4 MG SL SUBL: SUBLINGUAL_TABLET | SUBLINGUAL | Status: DC

## 2013-02-07 NOTE — Patient Instructions (Addendum)
Your physician recommends that you return for a FASTING LIPID and LIVER profile--nothing to eat or drink after midnight, lab opens at 7:30  Your physician wants you to follow-up in: 1 YEAR with Dr Excell Seltzer. You will receive a reminder letter in the mail two months in advance. If you don't receive a letter, please call our office to schedule the follow-up appointment.  Your physician recommends that you continue on your current medications as directed. Please refer to the Current Medication list given to you today.

## 2013-02-07 NOTE — Progress Notes (Signed)
HPI:  52 year old gentleman presenting for followup evaluation. He has previously been followed by Dr. Riley Kill. He has a history of coronary artery disease with remote inferior wall MI. His most recent cardiac catheterization was in 2008 when he had continued stent patency in the right coronary artery with mild in-stent restenosis. There was mild to moderate stenosis of the left circumflex. The LAD was widely patent. His left ventricular ejection fraction was 50% with mild inferior wall hypokinesis. Last lipids were checked one year ago with a total cholesterol of 117, trig was read 65, HDL 36, and LDL 68.  The patient is doing well. He has occasional upper chest pain when he puts his chin down to his chest and then raises his chin up in the air. He denies any exertional chest pain. 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 He has no chest pain or tightness with physical exertion. He denies shortness of breath, edema, or palpitations. Unfortunately he continues to smoke cigarettes. He's tried everything to quit but cannot do so.  Outpatient Encounter Prescriptions as of 02/07/2013  Medication Sig Dispense Refill  . aspirin 81 MG tablet Take 81 mg by mouth daily.        . fish oil-omega-3 fatty acids 1000 MG capsule Take 2 g by mouth daily.       . metoprolol succinate (TOPROL-XL) 25 MG 24 hr tablet Take 1 tablet (25 mg total) by mouth daily.  90 tablet  3  . NITROSTAT 0.4 MG SL tablet DISSOLVE 1 TAB UNDER TONGUE EVERY 5 MINUTES AS NEEDED FOR CHEST PAIN MAY REPEAT 3 TIMES  25 tablet  3  . PROAIR HFA 108 (90 BASE) MCG/ACT inhaler Ad lib.      . rosuvastatin (CRESTOR) 10 MG tablet Take 1 tablet (10 mg total) by mouth daily.  90 tablet  3  . [DISCONTINUED] pentazocine-naloxone (TALWIN NX) 50-0.5 MG per tablet Take 1 tablet by mouth every 4 (four) hours as needed for pain.  30 tablet  0   No facility-administered encounter medications on file as of 02/07/2013.    Allergies    Allergen Reactions  . Percocet (Oxycodone-Acetaminophen) Nausea And Vomiting    Past Medical History  Diagnosis Date  . Hypertension   . Hyperlipidemia   . History of heart attack     seen by Dr Riley Kill  . Coronary artery disease   . Myocardial infarction 2005-2006?    stent 07  . Arthritis     ROS: Negative except as per HPI  BP 110/84  Pulse 70  Wt 79.379 kg (175 lb)  BMI 27.4 kg/m2  SpO2 97%  PHYSICAL EXAM: Pt is alert and oriented, NAD HEENT: normal Neck: JVP - normal, carotids 2+= without bruits Lungs: CTA bilaterally CV: RRR without murmur or gallop Abd: soft, NT, Positive BS, no hepatomegaly Ext: no C/C/E, distal pulses intact and equal Skin: warm/dry no rash  EKG:  Normal sinus rhythm with nonspecific ST abnormality.  ASSESSMENT AND PLAN: 1. Coronary artery disease, native vessel. The patient is stable without anginal symptoms. He'll continue on his current medical program which includes aspirin for antiplatelet therapy, beta blocker, and a statin drug. He understands the importance of tobacco cessation. We discussed this at length today.  2. Hyperlipidemia. The patient is on Crestor 10 mg daily. He also takes fish oil. He is due for lipids and LFTs in this will be scheduled.  3. Tobacco. Cessation was discussed at length. He  understands this is the most important modifiable risk factor concerning ongoing myocardial infarction risk.  For followup will see him back in one year.  Tonny Bollman 02/07/2013 12:58 PM

## 2013-02-14 ENCOUNTER — Other Ambulatory Visit (INDEPENDENT_AMBULATORY_CARE_PROVIDER_SITE_OTHER): Payer: Managed Care, Other (non HMO)

## 2013-02-14 ENCOUNTER — Other Ambulatory Visit: Payer: Self-pay | Admitting: *Deleted

## 2013-02-14 DIAGNOSIS — I2510 Atherosclerotic heart disease of native coronary artery without angina pectoris: Secondary | ICD-10-CM

## 2013-02-14 DIAGNOSIS — E7800 Pure hypercholesterolemia, unspecified: Secondary | ICD-10-CM

## 2013-02-14 LAB — HEPATIC FUNCTION PANEL
Albumin: 4 g/dL (ref 3.5–5.2)
Total Protein: 7.1 g/dL (ref 6.0–8.3)

## 2013-02-14 LAB — LIPID PANEL
Cholesterol: 123 mg/dL (ref 0–200)
HDL: 28.5 mg/dL — ABNORMAL LOW (ref >39.00)
LDL Cholesterol: 72 mg/dL (ref 0–99)
Total CHOL/HDL Ratio: 4
Triglycerides: 115 mg/dL (ref 0.0–149.0)
VLDL: 23 mg/dL (ref 0.0–40.0)

## 2013-02-14 MED ORDER — ROSUVASTATIN CALCIUM 10 MG PO TABS: 10.0000 mg | ORAL_TABLET | Freq: Every day | ORAL | Status: DC

## 2013-02-14 MED ORDER — METOPROLOL SUCCINATE ER 25 MG PO TB24: 25.0000 mg | ORAL_TABLET | Freq: Every day | ORAL | Status: DC

## 2013-02-16 ENCOUNTER — Encounter: Payer: Self-pay | Admitting: Cardiovascular Disease

## 2013-02-16 NOTE — Telephone Encounter (Signed)
New problem:  Test results.  

## 2013-02-16 NOTE — Telephone Encounter (Signed)
This encounter was created in error - please disregard.

## 2013-08-04 ENCOUNTER — Other Ambulatory Visit: Payer: Self-pay

## 2014-02-27 ENCOUNTER — Encounter: Payer: Self-pay | Admitting: Physician Assistant

## 2014-02-27 ENCOUNTER — Ambulatory Visit (INDEPENDENT_AMBULATORY_CARE_PROVIDER_SITE_OTHER): Payer: Managed Care, Other (non HMO) | Admitting: Physician Assistant

## 2014-02-27 VITALS — BP 120/70 | HR 85 | Ht 67.0 in | Wt 163.0 lb

## 2014-02-27 DIAGNOSIS — I2510 Atherosclerotic heart disease of native coronary artery without angina pectoris: Secondary | ICD-10-CM

## 2014-02-27 DIAGNOSIS — E7800 Pure hypercholesterolemia, unspecified: Secondary | ICD-10-CM

## 2014-02-27 DIAGNOSIS — F17200 Nicotine dependence, unspecified, uncomplicated: Secondary | ICD-10-CM

## 2014-02-27 MED ORDER — ROSUVASTATIN CALCIUM 10 MG PO TABS: 10.0000 mg | ORAL_TABLET | Freq: Every day | ORAL | Status: DC

## 2014-02-27 MED ORDER — NITROGLYCERIN 0.4 MG SL SUBL
SUBLINGUAL_TABLET | SUBLINGUAL | Status: DC
Start: 2014-02-27 — End: 2014-03-22

## 2014-02-27 MED ORDER — METOPROLOL SUCCINATE ER 25 MG PO TB24: 25.0000 mg | ORAL_TABLET | Freq: Every day | ORAL | Status: DC

## 2014-02-27 NOTE — Progress Notes (Signed)
Cardiology Office Note   Date:  02/27/2014   ID:  [name], DOB April 12, 1961, MRN [id number]  PCP:  Simona Huh, MD  Cardiologist:  Dr. Sherren Mocha      History of Present Illness: [name] is a 53 y.o. male with a hx of CAD, s/p remote inferior MI tx with stenting to the RCA, HL, tobacco abuse.  Last seen by Dr. Sherren Mocha in 01/2013.  He was seen in the ED in [facility] 5/25 for chest pain and syncope. CEs were normal.  CXR was normal.  ECG report indicates no abnormalities.  No further workup was pursued.    The patient tells me that his son, who is mentally challenged, went missing in his ex-wife's car. It took a couple of hours to find him. The police were looking for him. He was quite upset. It was very hot that day. By the time he got to the scene where his son was found, he was very upset.  He had significant palpitations. He felt some chest tightness. He's not certain that he passed out. He had one other episode of chest discomfort with exertion while at work 1 month ago. 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 Since then, he has not had any chest discomfort with exertion or lifting. He denies significant dyspnea. He denies orthopnea, PND or edema. He denies any further syncope or near syncope.   Studies:  - LHC (04/2007):  Mid RCA stent ok with 40% ISR, OM 50%, EF 50%, inf HK.  - Echo (04/2006):  EF 55-60%   Recent Labs: No results found for requested labs within last 365 days. 02/20/2014:  Hgb 15.2, potassium 4.5, creatinine 0.88, ALT 21  Wt Readings from Last 3 Encounters:  02/07/13 175 lb (79.379 kg)  09/27/12 161 lb 3 oz (73.114 kg)  09/27/12 161 lb 3 oz (73.114 kg)     Past Medical History  Diagnosis Date  . Hypertension   . Hyperlipidemia   . History of heart attack     seen by Dr Lia Foyer  . Coronary artery disease   . Myocardial infarction 2005-2006?    stent 07  . Arthritis     Current Outpatient Prescriptions  Medication Sig Dispense Refill    . sodium chloride 0.9 % infusion Inject 1,000 mLs into the vein.      Marland Kitchen aspirin 81 MG chewable tablet Chew 81 mg by mouth.      Marland Kitchen aspirin 81 MG tablet Take 81 mg by mouth daily.        . fish oil-omega-3 fatty acids 1000 MG capsule Take 2 g by mouth daily.       . metoprolol succinate (TOPROL-XL) 100 MG 24 hr tablet Take 100 mg by mouth.      . metoprolol succinate (TOPROL-XL) 25 MG 24 hr tablet Take 1 tablet (25 mg total) by mouth daily.  90 tablet  3  . nitroGLYCERIN (NITROSTAT) 0.4 MG SL tablet DISSOLVE 1 TAB UNDER TONGUE EVERY 5 MINUTES AS NEEDED FOR CHEST PAIN MAY REPEAT 3 TIMES  25 tablet  3  . Omega-3 Fatty Acids (FISH OIL) 1000 MG CAPS Take 1 capsule by mouth.      Marland Kitchen PROAIR HFA 108 (90 BASE) MCG/ACT inhaler Ad lib.      . rosuvastatin (CRESTOR) 10 MG tablet Take 1 tablet (10 mg total) by mouth daily.  90 tablet  3  . rosuvastatin (CRESTOR) 10 MG tablet Take 10 mg by mouth.      Marland Kitchen  TAMIFLU 75 MG capsule        No current facility-administered medications for this visit.    Allergies:   Percocet   Social History:  The patient  reports that he has been smoking.  He has never used smokeless tobacco. He reports that he does not drink alcohol or use illicit drugs.   Family History:  The patient's family history includes Aneurysm in his father; Cancer in his maternal uncle; Heart attack in his mother; Heart disease in his mother.   ROS:  Please see the history of present illness.  He notes recent URI symptoms.    All other systems reviewed and negative.   PHYSICAL EXAM: VS:  BP 120/70  Pulse 85  Ht 5\' 7"  (1.702 m)  Wt 163 lb (73.936 kg)  BMI 25.52 kg/m2 Well nourished, well developed, in no acute distress HEENT: normal Neck: no JVD Vascular: No carotid bruits Cardiac:  normal S1, S2; RRR; no murmur Lungs:  clear to auscultation bilaterally, no wheezing, rhonchi or rales Abd: soft, nontender, no hepatomegaly Ext: no edema Skin: warm and dry Neuro:  CNs 2-12 intact, no focal  abnormalities noted  EKG:  NSR, HR 80, normal axis, inferior T wave inversions, no significant change when compared to prior tracings     ASSESSMENT AND PLAN:  1. Chest Pain:  He has had a couple of episodes of chest tightness. He has been able to exert himself without symptoms since. These are not like what he had with his prior MI. He has not had an ischemic evaluation in quite some time. Arrange ETT-Myoview. 2. Syncope:  He likely had a vasovagal episode. Arrange ETT-Myoview as noted above. If his ejection fraction is normal, no further workup unless he has recurrence. 3. Coronary Artery Disease, s/p remote Inferior MI tx with stent to RCA: Arrange Myoview as noted. Continue aspirin, beta blocker, statin. 4. HYPERCHOLESTEROLEMIA: Arrange fasting lipids. Continue statin. 5. CIGARETTE SMOKER: We discussed the importance of quitting. 6. Disposition:  F/u with Dr. Sherren Mocha in 6 mos or sooner if Myoview abnormal.     Signed, Versie Starks, MHS 02/27/2014 10:32 AM    [address], [place], [place]  [address] Phone: [phone]; Fax: [phone]

## 2014-02-27 NOTE — Patient Instructions (Signed)
Your physician has requested that you have en exercise stress myoview. For further information please visit [email or web address]. Please follow instruction sheet, as given.  Your physician recommends that you return for a FASTING lipid profile on June 15 with your exercise stress myoview  Your physician wants you to follow-up in: 6 months with Dr. Burt Knack.  You will receive a reminder letter in the mail two months in advance. If you don't receive a letter, please call our office to schedule the follow-up appointment.  I refilled you medications today to [facility] in [place]:  Nitroglycerin/crestor/toprol2

## 2014-03-07 ENCOUNTER — Encounter: Payer: Self-pay | Admitting: Cardiology

## 2014-03-13 ENCOUNTER — Ambulatory Visit (HOSPITAL_COMMUNITY): Payer: Managed Care, Other (non HMO) | Attending: Cardiology | Admitting: Radiology

## 2014-03-13 ENCOUNTER — Other Ambulatory Visit (INDEPENDENT_AMBULATORY_CARE_PROVIDER_SITE_OTHER): Payer: Managed Care, Other (non HMO)

## 2014-03-13 VITALS — BP 116/79 | Ht 67.0 in | Wt 156.0 lb

## 2014-03-13 DIAGNOSIS — R002 Palpitations: Secondary | ICD-10-CM | POA: Insufficient documentation

## 2014-03-13 DIAGNOSIS — I252 Old myocardial infarction: Secondary | ICD-10-CM | POA: Insufficient documentation

## 2014-03-13 DIAGNOSIS — R0789 Other chest pain: Secondary | ICD-10-CM

## 2014-03-13 DIAGNOSIS — I10 Essential (primary) hypertension: Secondary | ICD-10-CM | POA: Insufficient documentation

## 2014-03-13 DIAGNOSIS — R0989 Other specified symptoms and signs involving the circulatory and respiratory systems: Secondary | ICD-10-CM

## 2014-03-13 DIAGNOSIS — F17200 Nicotine dependence, unspecified, uncomplicated: Secondary | ICD-10-CM

## 2014-03-13 DIAGNOSIS — R55 Syncope and collapse: Secondary | ICD-10-CM | POA: Insufficient documentation

## 2014-03-13 DIAGNOSIS — I2510 Atherosclerotic heart disease of native coronary artery without angina pectoris: Secondary | ICD-10-CM

## 2014-03-13 DIAGNOSIS — R0609 Other forms of dyspnea: Secondary | ICD-10-CM

## 2014-03-13 DIAGNOSIS — E7800 Pure hypercholesterolemia, unspecified: Secondary | ICD-10-CM

## 2014-03-13 DIAGNOSIS — Z9861 Coronary angioplasty status: Secondary | ICD-10-CM | POA: Insufficient documentation

## 2014-03-13 DIAGNOSIS — Z8249 Family history of ischemic heart disease and other diseases of the circulatory system: Secondary | ICD-10-CM | POA: Insufficient documentation

## 2014-03-13 LAB — LIPID PANEL
CHOL/HDL RATIO: 3
CHOLESTEROL: 130 mg/dL (ref 0–200)
HDL: 39.8 mg/dL (ref >39.00)
LDL Cholesterol: 67 mg/dL (ref 0–99)
NonHDL: 90.2
TRIGLYCERIDES: 117 mg/dL (ref 0.0–149.0)
VLDL: 23.4 mg/dL (ref 0.0–40.0)

## 2014-03-13 MED ORDER — TECHNETIUM TC 99M SESTAMIBI GENERIC - CARDIOLITE
30.0000 | Freq: Once | INTRAVENOUS | Status: AC | PRN
  Administered 2014-03-13: 30 via INTRAVENOUS

## 2014-03-13 MED ORDER — TECHNETIUM TC 99M SESTAMIBI GENERIC - CARDIOLITE
10.0000 | Freq: Once | INTRAVENOUS | Status: AC | PRN
  Administered 2014-03-13: 10 via INTRAVENOUS

## 2014-03-13 NOTE — Progress Notes (Signed)
[facility]UCLE[facility] [address] [place], [place] [address] [phone]    Cardiology Nuclear Med Study  [name] is a 53 y.o. male     MRN : [id number]     DOB: May 30, 1961  Procedure Date: 03/13/2014  Nuclear Med Background Indication for Stress Test:  Evaluation for Ischemia, Stent Patency, and Patient seen in hospital ED on 02-20-2014 for Chest Pain, Syncope, Enzymes negative History:  CAD-MI-CATH-STENT-RCA- 04/30/06 EF: 55-60% Cardiac Risk Factors: Family History - CAD, Hypertension, Lipids and Smoker  Symptoms:  Chest Tightness, Palpitations and Syncope   Nuclear Pre-Procedure Caffeine/Decaff Intake:  None> 12 hrs NPO After: 6:30am   Lungs:  clear O2 Sat: 97% on room air. IV 0.9% NS with Angio Cath:  22g  IV Site: R Antecubital x 1, tolerated well IV Started by:  Irven Baltimore, RN  Chest Size (in):  38 Cup Size: n/a  Height: 5\' 7"  (1.702 m)  Weight:  156 lb (70.761 kg)  BMI:  Body mass index is 24.43 kg/(m^2). 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 Irven Baltimore, RN.    Nuclear Med Study 1 or 2 day study: 1 day  Stress Test Type:  Stress  Reading MD: N/A  Order Authorizing Provider:  Sherren Mocha, MD, and Richardson Dopp, [facility]  Resting Radionuclide: Technetium 30m Sestamibi  Resting Radionuclide Dose: 11.0 mCi   Stress Radionuclide:  Technetium 31m Sestamibi  Stress Radionuclide Dose: 33.0 mCi           Stress Protocol Rest HR: 62 Stress HR: 148  Rest BP: 116/79 Stress BP: 170/77  Exercise Time (min): 9:00 METS: 10.10   Predicted Max HR: 168 bpm % Max HR: 88.1 bpm Rate Pressure Product: 25160   Dose of Adenosine (mg):  n/a Dose of Lexiscan: n/a mg  Dose of Atropine (mg): n/a Dose of Dobutamine: n/a mcg/kg/min (at max HR)  Stress Test Technologist: Perrin Maltese, EMT-P  Nuclear Technologist:  Charlton Amor, CNMT     Rest Procedure:  Myocardial perfusion imaging was performed at rest 45 minutes following the intravenous  administration of Technetium 40m Sestamibi. Rest ECG: NSR - Normal EKG  Stress Procedure:  The patient exercised on the treadmill utilizing the Bruce Protocol for 9:00 minutes. The patient stopped due to fatigue, sob, and denied any chest pain.  Technetium 20m Sestamibi was injected at peak exercise and myocardial perfusion imaging was performed after a brief delay. Stress ECG: Significant ST abnormalities consistent with ischemia.  QPS Raw Data Images:  Normal; no motion artifact; normal heart/lung ratio. Stress Images:  There is decreased uptake in the inferior wall. Rest Images:  There is decreased uptake in the inferior wall. Subtraction (SDS):  There is a fixed defect that is most consistent with a previous infarction with partial reversibility. Transient Ischemic Dilatation (Normal <1.22):  0.98 Lung/Heart Ratio (Normal <0.45):  0.26  Quantitative Gated Spect Images QGS EDV:  116 ml QGS ESV:  55 ml  Impression Exercise Capacity:  Good exercise capacity. BP Response:  Normal blood pressure response. Clinical Symptoms:  There is dyspnea. ECG Impression:  Significant ST abnormalities consistent with ischemia. Comparison with Prior Nuclear Study: No images to compare  Overall Impression:  Intermediate risk stress nuclear study. There is a small sized perfusion defect involving the apical inferior and mid-inferior segments of moderate severity. There is partial reversibility (SDS 3). The patient demonstrated good exercise tolerance. He developed dyspnea and low back pain but no chest pain. EKG  with stress shows downward sloping ST segments in the inferolateral leads.  LV Ejection Fraction: 52%.  LV Wall Motion:  NL LV Function; NL Wall Motion  Darlin Coco MD

## 2014-03-14 ENCOUNTER — Telehealth: Payer: Self-pay | Admitting: *Deleted

## 2014-03-14 NOTE — Telephone Encounter (Signed)
pt's wife notified about lab results. She asked if myoview results were in, I said test has not yet been read, but once read I will cb with those results as well, she said thank you.

## 2014-03-16 ENCOUNTER — Encounter: Payer: Self-pay | Admitting: Physician Assistant

## 2014-03-16 ENCOUNTER — Encounter: Payer: Self-pay | Admitting: *Deleted

## 2014-03-16 ENCOUNTER — Ambulatory Visit (INDEPENDENT_AMBULATORY_CARE_PROVIDER_SITE_OTHER): Payer: Managed Care, Other (non HMO) | Admitting: Physician Assistant

## 2014-03-16 VITALS — BP 118/70 | HR 78 | Ht 67.0 in | Wt 157.0 lb

## 2014-03-16 DIAGNOSIS — E7800 Pure hypercholesterolemia, unspecified: Secondary | ICD-10-CM

## 2014-03-16 DIAGNOSIS — F17200 Nicotine dependence, unspecified, uncomplicated: Secondary | ICD-10-CM

## 2014-03-16 DIAGNOSIS — R079 Chest pain, unspecified: Secondary | ICD-10-CM

## 2014-03-16 DIAGNOSIS — R9439 Abnormal result of other cardiovascular function study: Secondary | ICD-10-CM

## 2014-03-16 DIAGNOSIS — I2510 Atherosclerotic heart disease of native coronary artery without angina pectoris: Secondary | ICD-10-CM

## 2014-03-16 NOTE — H&P (Signed)
Agree with plan as outlined by Richardson Dopp, PA-C. We have discussed his case in detail and I reviewed plans for cath and possible PCI with the patient and his wife today.

## 2014-03-16 NOTE — H&P (Signed)
History and Physical   Date:  03/16/2014   ID:  [name], DOB [birth year], MRN [id number]  PCP:  Simona Huh, MD  Cardiologist:  Dr. Sherren Mocha      History of Present Illness: [name] is a 53 y.o. male with a hx of CAD, s/p remote inferior MI tx with stenting to the RCA, HL, tobacco abuse.  Last seen by Dr. Sherren Mocha in 01/2013.  He was seen in the ED in [facility] 5/25 for chest pain and syncope (son was missing for several hours and he was very upset). CEs were normal.  CXR was normal.  ECG report indicates no abnormalities.  No further workup was pursued.  I saw him in follow up 02/27/14.  He noted a couple of other episodes of chest pain.  Myoview was arranged and demonstrated apical inferior and mid inferior perfusion defect with partial reversibility, EF 52%. This was reviewed with Dr. Burt Knack who recommended proceeding with cardiac catheterization. Patient presents for followup to discuss.  Since last seen, he has noted chest tightness with certain activities. He probably describes CCS class 2-3 symptoms.  He denies significant dyspnea. He denies orthopnea, PND or edema. He denies syncope.   Studies:  - LHC (04/2007):  Mid RCA stent ok with 40% ISR, OM 50%, EF 50%, inf HK.  - Echo (04/2006):  EF 55-60%  - Nuclear (03/14/14):  Intermediate risk, EF 52%, apical inferior and mid inferior perfusion defect with partial reversibility.   Recent Labs: 03/13/2014: HDL Cholesterol by NMR 39.80; LDL (calc) 67 02/20/2014:  Hgb 15.2, potassium 4.5, creatinine 0.88, ALT 21   Wt Readings from Last 3 Encounters:  03/16/14 157 lb (71.215 kg)  03/13/14 156 lb (70.761 kg)  02/27/14 163 lb (73.936 kg)     Past Medical History  Diagnosis Date  . Hypertension   . Hyperlipidemia   . History of heart attack     seen by Dr Lia Foyer  . Coronary artery disease   . Myocardial infarction 2005-2006?    stent 07  . Arthritis     Current Outpatient Prescriptions  Medication  Sig Dispense Refill  . aspirin 81 MG tablet Take 81 mg by mouth daily.        . metoprolol succinate (TOPROL-XL) 25 MG 24 hr tablet Take 1 tablet (25 mg total) by mouth daily.  90 tablet  3  . nitroGLYCERIN (NITROSTAT) 0.4 MG SL tablet DISSOLVE 1 TAB UNDER TONGUE EVERY 5 MINUTES AS NEEDED FOR CHEST PAIN MAY REPEAT 3 TIMES  25 tablet  3  . Omega-3 Fatty Acids (FISH OIL) 1000 MG CAPS Take 3 capsules by mouth.       . rosuvastatin (CRESTOR) 10 MG tablet Take 1 tablet (10 mg total) by mouth daily.  90 tablet  3   No current facility-administered medications for this visit.    Allergies:   Percocet   Social History:  The patient  reports that he has been smoking.  He has never used smokeless tobacco. He reports that he does not drink alcohol or use illicit drugs.   Family History:  The patient's family history includes Aneurysm in his father; Cancer in his maternal uncle; Heart attack in his mother; Heart disease in his mother.   ROS:  Please see the history of present illness.      All other systems reviewed and negative.   PHYSICAL EXAM: VS:  BP 118/70  Pulse 78  Ht 5\' 7"  (1.702 m)  Wt 157 lb (71.215 kg)  BMI 24.58 kg/m2 Well nourished, well developed, in no acute distress HEENT: normal Neck: no JVD Vascular: No carotid bruits Endocrine: No thyromegaly Cardiac:  normal S1, S2; RRR; no murmur Lungs:  clear to auscultation bilaterally, no wheezing, rhonchi or rales Abd: soft, nontender, no hepatomegaly Ext: no edema Skin: warm and dry Neuro:  CNs 2-12 intact, no focal abnormalities noted    ASSESSMENT AND PLAN:  1. Coronary Artery Disease, s/p remote Inferior MI tx with stent to RCA:   He has had recent episodes of chest discomfort that are concerning for CCS class 2-3 angina. He has a recent nuclear study that is intermediate risk. As noted, I have reviewed this with Dr. Burt Knack. We have recommended proceeding with cardiac catheterization.  Risks and benefits of cardiac  catheterization have been discussed with the patient.  These include bleeding, infection, kidney damage, stroke, heart attack, death.  The patient understands these risks and is willing to proceed.  Continue aspirin, beta blocker, statin. 2. HYPERCHOLESTEROLEMIA:   Continue statin. 3. CIGARETTE SMOKER:  Cessation is recommended.  4. Disposition:  Proceed with cardiac cath as noted.       Signed, Versie Starks, MHS 03/16/2014 3:36 PM    [facility] [place], [place], [place]  [address] Phone: [phone]; Fax: [phone]

## 2014-03-16 NOTE — Progress Notes (Signed)
Cardiology Office Note   Date:  03/16/2014   ID:  [name], DOB 20-Feb-1961, MRN [id number]  PCP:  Simona Huh, MD  Cardiologist:  Dr. Sherren Mocha      History of Present Illness: [name] is a 53 y.o. male with a hx of CAD, s/p remote inferior MI tx with stenting to the RCA, HL, tobacco abuse.  Last seen by Dr. Sherren Mocha in 01/2013.  He was seen in the ED in [facility] 5/25 for chest pain and syncope (son was missing for several hours and he was very upset). CEs were normal.  CXR was normal.  ECG report indicates no abnormalities.  No further workup was pursued.  I saw him in follow up 02/27/14.  He noted a couple of other episodes of chest pain.  Myoview was arranged and demonstrated apical inferior and mid inferior perfusion defect with partial reversibility, EF 52%. This was reviewed with Dr. Burt Knack who recommended proceeding with cardiac catheterization. Patient presents for followup to discuss.  Since last seen, he has noted chest tightness with certain activities. He probably describes CCS class 2-3 symptoms.  He denies significant dyspnea. He denies orthopnea, PND or edema. He denies syncope.   Studies:  - LHC (04/2007):  Mid RCA stent ok with 40% ISR, OM 50%, EF 50%, inf HK.  - Echo (04/2006):  EF 55-60%  - Nuclear (03/14/14):  Intermediate risk, EF 52%, apical inferior and mid inferior perfusion defect with partial reversibility.   Recent Labs: 03/13/2014: HDL Cholesterol by NMR 39.80; LDL (calc) 67 02/20/2014:  Hgb 15.2, potassium 4.5, creatinine 0.88, ALT 21   Wt Readings from Last 3 Encounters:  03/16/14 157 lb (71.215 kg)  03/13/14 156 lb (70.761 kg)  02/27/14 163 lb (73.936 kg)     Past Medical History  Diagnosis Date  . Hypertension   . Hyperlipidemia   . History of heart attack     seen by Dr Lia Foyer  . Coronary artery disease   . Myocardial infarction 2005-2006?    stent 07  . Arthritis     Current Outpatient Prescriptions  Medication  Sig Dispense Refill  . aspirin 81 MG tablet Take 81 mg by mouth daily.        . metoprolol succinate (TOPROL-XL) 25 MG 24 hr tablet Take 1 tablet (25 mg total) by mouth daily.  90 tablet  3  . nitroGLYCERIN (NITROSTAT) 0.4 MG SL tablet DISSOLVE 1 TAB UNDER TONGUE EVERY 5 MINUTES AS NEEDED FOR CHEST PAIN MAY REPEAT 3 TIMES  25 tablet  3  . Omega-3 Fatty Acids (FISH OIL) 1000 MG CAPS Take 3 capsules by mouth.       . rosuvastatin (CRESTOR) 10 MG tablet Take 1 tablet (10 mg total) by mouth daily.  90 tablet  3   No current facility-administered medications for this visit.    Allergies:   Percocet   Social History:  The patient  reports that he has been smoking.  He has never used smokeless tobacco. He reports that he does not drink alcohol or use illicit drugs.   Family History:  The patient's family history includes Aneurysm in his father; Cancer in his maternal uncle; Heart attack in his mother; Heart disease in his mother.   ROS:  Please see the history of present illness.      All other systems reviewed and negative.   PHYSICAL EXAM: VS:  BP 118/70  Pulse 78  Ht 5\' 7"  (1.702 m)  Wt 157 lb (71.215 kg)  BMI 24.58 kg/m2 Well nourished, well developed, in no acute distress HEENT: normal Neck: no JVD Vascular: No carotid bruits Endocrine: No thyromegaly Cardiac:  normal S1, S2; RRR; no murmur Lungs:  clear to auscultation bilaterally, no wheezing, rhonchi or rales Abd: soft, nontender, no hepatomegaly Ext: no edema Skin: warm and dry Neuro:  CNs 2-12 intact, no focal abnormalities noted    ASSESSMENT AND PLAN:  1. Coronary Artery Disease, s/p remote Inferior MI tx with stent to RCA:   He has had recent episodes of chest discomfort that are concerning for CCS class 2-3 angina. He has a recent nuclear study that is intermediate risk. As noted, I have reviewed this with Dr. Burt Knack. We have recommended proceeding with cardiac catheterization.  Risks and benefits of cardiac  catheterization have been discussed with the patient.  These include bleeding, infection, kidney damage, stroke, heart attack, death.  The patient understands these risks and is willing to proceed.  Continue aspirin, beta blocker, statin. 2. HYPERCHOLESTEROLEMIA:   Continue statin. 3. CIGARETTE SMOKER:  Cessation is recommended.  4. Disposition:  Proceed with cardiac cath as noted.       Signed, Versie Starks, MHS 03/16/2014 3:36 PM    [facility] [place], [place], [place]  [address] Phone: [phone]; Fax: [phone]

## 2014-03-16 NOTE — Patient Instructions (Signed)
Your physician has requested that you have a cardiac catheterization. Cardiac catheterization is used to diagnose and/or treat various heart conditions. Doctors may recommend this procedure for a number of different reasons. The most common reason is to evaluate chest pain. Chest pain can be a symptom of coronary artery disease (CAD), and cardiac catheterization can show whether plaque is narrowing or blocking your heart's arteries. This procedure is also used to evaluate the valves, as well as measure the blood flow and oxygen levels in different parts of your heart. For further information please visit [email or web address]. Please follow instruction sheet, as given.  LAB WORK TODAY BMET, CBC W.DIFF, PT/INR

## 2014-03-17 ENCOUNTER — Telehealth: Payer: Self-pay | Admitting: *Deleted

## 2014-03-17 LAB — CBC WITH DIFFERENTIAL/PLATELET
BASOS ABS: 0 10*3/uL (ref 0.0–0.1)
Basophils Relative: 0.4 % (ref 0.0–3.0)
EOS ABS: 0.1 10*3/uL (ref 0.0–0.7)
Eosinophils Relative: 1.8 % (ref 0.0–5.0)
HCT: 45.1 % (ref 39.0–52.0)
Hemoglobin: 15.3 g/dL (ref 13.0–17.0)
LYMPHS PCT: 25.6 % (ref 12.0–46.0)
Lymphs Abs: 1.9 10*3/uL (ref 0.7–4.0)
MCHC: 33.9 g/dL (ref 30.0–36.0)
MCV: 91.6 fl (ref 78.0–100.0)
MONOS PCT: 6.3 % (ref 3.0–12.0)
Monocytes Absolute: 0.5 10*3/uL (ref 0.1–1.0)
NEUTROS ABS: 4.8 10*3/uL (ref 1.4–7.7)
Neutrophils Relative %: 65.9 % (ref 43.0–77.0)
PLATELETS: 193 10*3/uL (ref 150.0–400.0)
RBC: 4.93 Mil/uL (ref 4.22–5.81)
RDW: 13.7 % (ref 11.5–15.5)
WBC: 7.3 10*3/uL (ref 4.0–10.5)

## 2014-03-17 LAB — BASIC METABOLIC PANEL
BUN: 17 mg/dL (ref 6–23)
CALCIUM: 9.2 mg/dL (ref 8.4–10.5)
CHLORIDE: 106 meq/L (ref 96–112)
CO2: 30 mEq/L (ref 19–32)
CREATININE: 1 mg/dL (ref 0.4–1.5)
GFR: 85.12 mL/min (ref >60.00)
Glucose, Bld: 94 mg/dL (ref 70–99)
Potassium: 4.2 mEq/L (ref 3.5–5.1)
Sodium: 141 mEq/L (ref 135–145)

## 2014-03-17 LAB — PROTIME-INR
INR: 1 ratio (ref 0.8–1.0)
PROTHROMBIN TIME: 10.6 s (ref 9.6–13.1)

## 2014-03-17 NOTE — Telephone Encounter (Signed)
pt's wife notified about lab results ok for cath, wife verbalized undestanding

## 2014-03-22 ENCOUNTER — Encounter (HOSPITAL_COMMUNITY): Payer: Self-pay | Admitting: Pharmacy Technician

## 2014-03-29 ENCOUNTER — Ambulatory Visit (HOSPITAL_COMMUNITY)
Admission: RE | Admit: 2014-03-29 | Discharge: 2014-03-29 | Disposition: A | Discharge status: Home / Self Care | Payer: Managed Care, Other (non HMO) | Source: Ambulatory Visit | Attending: Cardiovascular Disease | Admitting: Cardiovascular Disease

## 2014-03-29 ENCOUNTER — Encounter (HOSPITAL_COMMUNITY)
Admission: RE | Disposition: A | Discharge status: Home / Self Care | Payer: Self-pay | Source: Ambulatory Visit | Attending: Cardiovascular Disease

## 2014-03-29 DIAGNOSIS — I2510 Atherosclerotic heart disease of native coronary artery without angina pectoris: Secondary | ICD-10-CM

## 2014-03-29 DIAGNOSIS — F17200 Nicotine dependence, unspecified, uncomplicated: Secondary | ICD-10-CM | POA: Insufficient documentation

## 2014-03-29 DIAGNOSIS — I10 Essential (primary) hypertension: Secondary | ICD-10-CM | POA: Insufficient documentation

## 2014-03-29 DIAGNOSIS — R9439 Abnormal result of other cardiovascular function study: Secondary | ICD-10-CM

## 2014-03-29 DIAGNOSIS — M129 Arthropathy, unspecified: Secondary | ICD-10-CM | POA: Insufficient documentation

## 2014-03-29 DIAGNOSIS — T82897A Other specified complication of cardiac prosthetic devices, implants and grafts, initial encounter: Secondary | ICD-10-CM | POA: Insufficient documentation

## 2014-03-29 DIAGNOSIS — E785 Hyperlipidemia, unspecified: Secondary | ICD-10-CM | POA: Insufficient documentation

## 2014-03-29 DIAGNOSIS — R079 Chest pain, unspecified: Secondary | ICD-10-CM

## 2014-03-29 DIAGNOSIS — Z7982 Long term (current) use of aspirin: Secondary | ICD-10-CM | POA: Insufficient documentation

## 2014-03-29 DIAGNOSIS — Y831 Surgical operation with implant of artificial internal device as the cause of abnormal reaction of the patient, or of later complication, without mention of misadventure at the time of the procedure: Secondary | ICD-10-CM | POA: Insufficient documentation

## 2014-03-29 DIAGNOSIS — I252 Old myocardial infarction: Secondary | ICD-10-CM | POA: Insufficient documentation

## 2014-03-29 HISTORY — PX: LEFT HEART CATHETERIZATION WITH CORONARY ANGIOGRAM: SHX5451

## 2014-03-29 SURGERY — LEFT HEART CATHETERIZATION WITH CORONARY ANGIOGRAM
Anesthesia: LOCAL

## 2014-03-29 MED ORDER — ONDANSETRON HCL 4 MG/2ML IJ SOLN: 4.0000 mg | Freq: Four times a day (QID) | INTRAMUSCULAR | Status: DC | PRN

## 2014-03-29 MED ORDER — HEPARIN (PORCINE) IN NACL 2-0.9 UNIT/ML-% IJ SOLN
INTRAMUSCULAR | Status: AC
Start: 2014-03-29 — End: 2014-03-29
  Filled 2014-03-29: qty 1500

## 2014-03-29 MED ORDER — ACETAMINOPHEN 325 MG PO TABS: 650.0000 mg | ORAL_TABLET | ORAL | Status: DC | PRN

## 2014-03-29 MED ORDER — SODIUM CHLORIDE 0.9 % IV SOLN
INTRAVENOUS | Status: DC
  Administered 2014-03-29: 75 mL/h via INTRAVENOUS

## 2014-03-29 MED ORDER — ASPIRIN 81 MG PO CHEW: 81.0000 mg | CHEWABLE_TABLET | ORAL | Status: DC

## 2014-03-29 MED ORDER — LIDOCAINE HCL (PF) 1 % IJ SOLN
INTRAMUSCULAR | Status: AC
  Filled 2014-03-29: qty 30

## 2014-03-29 MED ORDER — SODIUM CHLORIDE 0.9 % IJ SOLN: 3.0000 mL | Freq: Two times a day (BID) | INTRAMUSCULAR | Status: DC

## 2014-03-29 MED ORDER — SODIUM CHLORIDE 0.9 % IJ SOLN: 3.0000 mL | INTRAMUSCULAR | Status: DC | PRN

## 2014-03-29 MED ORDER — SODIUM CHLORIDE 0.9 % IV SOLN: 250.0000 mL | INTRAVENOUS | Status: DC | PRN

## 2014-03-29 MED ORDER — ASPIRIN 81 MG PO CHEW
CHEWABLE_TABLET | ORAL | Status: AC
  Administered 2014-03-29: 81 mg
  Filled 2014-03-29: qty 1

## 2014-03-29 MED ORDER — FENTANYL CITRATE 0.05 MG/ML IJ SOLN
INTRAMUSCULAR | Status: AC
  Filled 2014-03-29: qty 2

## 2014-03-29 MED ORDER — SODIUM CHLORIDE 0.9 % IV SOLN: 1.0000 mL/kg/h | INTRAVENOUS | Status: DC

## 2014-03-29 MED ORDER — MIDAZOLAM HCL 2 MG/2ML IJ SOLN
INTRAMUSCULAR | Status: AC
  Filled 2014-03-29: qty 2

## 2014-03-29 MED ORDER — VERAPAMIL HCL 2.5 MG/ML IV SOLN
INTRAVENOUS | Status: AC
  Filled 2014-03-29: qty 2

## 2014-03-29 MED ORDER — HEPARIN SODIUM (PORCINE) 1000 UNIT/ML IJ SOLN
INTRAMUSCULAR | Status: AC
  Filled 2014-03-29: qty 1

## 2014-03-29 NOTE — Discharge Instructions (Signed)
Radial Site Care Refer to this sheet in the next few weeks. These instructions provide you with information on caring for yourself after your procedure. Your caregiver may also give you more specific instructions. Your treatment has been planned according to current medical practices, but problems sometimes occur. Call your caregiver if you have any problems or questions after your procedure. HOME CARE INSTRUCTIONS  You may shower the day after the procedure.Remove the bandage (dressing) and gently wash the site with plain soap and water.Gently pat the site dry.  Do not apply powder or lotion to the site.  Do not submerge the affected site in water for 3 to 5 days.  Inspect the site at least twice daily.  Do not flex or bend the affected arm for 24 hours.  No lifting over 5 pounds (2.3 kg) for 5 days after your procedure.  Do not drive home if you are discharged the same day of the procedure. Have someone else drive you.  You may drive 24 hours after the procedure unless otherwise instructed by your caregiver.  Do not operate machinery or power tools for 24 hours.  A responsible adult should be with you for the first 24 hours after you arrive home. What to expect:  Any bruising will usually fade within 1 to 2 weeks.  Blood that collects in the tissue (hematoma) may be painful to the touch. It should usually decrease in size and tenderness within 1 to 2 weeks. SEEK IMMEDIATE MEDICAL CARE IF:  You have unusual pain at the radial site.  You have redness, warmth, swelling, or pain at the radial site.  You have drainage (other than a small amount of blood on the dressing).  You have chills.  You have a fever or persistent symptoms for more than 72 hours.  You have a fever and your symptoms suddenly get worse.  Your arm becomes pale, cool, tingly, or numb.  You have heavy bleeding from the site. Hold pressure on the site. Document Released: 10/18/2010 Document Revised:  12/08/2011 Document Reviewed: 10/18/2010 [facility] Patient Information 2015 [place], [place]. This information is not intended to replace advice given to you by your health care provider. Make sure you discuss any questions you have with your health care provider.                    Return To Work __[name]__ was treated at our facility. INJURY OR ILLNESS WAS: _____ Work-related ___X__ Not work-related _____ Undetermined if work-related RETURN TO WORK  Employee may return to work on: Monday July 6, 2015__  Employee may return to modified work on: ____________________ [place] Work activities not tolerated include: _____ Bending _____ Prolonged sitting _____ Lifting _____ Squatting _____ Prolonged standing _____ [name] _____ Reaching _____ Pushing and pulling _____ Walking _____ Other ____________________ Show this Return to Work statement to Optician, dispensing at work as soon as possible. Your employer should be aware of your condition and can help with the necessary work activity restrictions. If you wish to return to work sooner than the date above, or if you have further problems which make it difficult for you to return at that time, please call us or your caregiver. __Dr. Legrand Como Cooper___ Physician Name (Printed) _________________________________________ Physician Signature  __07/01/2015____ Date Document Released: 09/15/2005 Document Revised: 12/08/2011 Document Reviewed: 03/02/2007 [facility] Patient Information 2015 [place], [place]. This information is not intended to replace advice given to you by your health care provider. Make sure you discuss any  questions you have with your health care provider.

## 2014-03-29 NOTE — CV Procedure (Signed)
    Cardiac Catheterization Procedure Note  Name: [name] MRN: [id number] DOB: December 15, 1960  Procedure: Left Heart Cath, Selective Coronary Angiography, LV angiography  Indication: Chest Pain, abnormal Myoview   Procedural Details: The right wrist was prepped, draped, and anesthetized with 1% lidocaine. Using the modified Seldinger technique, a 5 French sheath was introduced into the right radial artery. 3 mg of verapamil was administered through the sheath, weight-based unfractionated heparin was administered intravenously. Standard Judkins catheters were used for selective coronary angiography and left ventriculography. Catheter exchanges were performed over an exchange length guidewire. There were no immediate procedural complications. A TR band was used for radial hemostasis at the completion of the procedure.  The patient was transferred to the post catheterization recovery area for further monitoring.  Procedural Findings: Hemodynamics: AO 97/58 with a mean of 77 LV 98/7  Coronary angiography: Coronary dominance: right  Left mainstem: The left main is patent. There is no obstructive disease the  Left anterior descending (LAD): The LAD is a large vessel. There is no obstructive disease noted. There is minor diffuse calcification. The diagonal branches are patent.  Left circumflex (LCx): The left circumflex is patent. The first OM branch is patent with mild 20-30% stenosis. The AV circumflex beyond the OM is small without significant disease.  Right coronary artery (RCA): The RCA is dominant. There is a long stent in the proximal vessel. The stent has 30-40% diffuse mid in-stent restenosis. There is no high-grade obstruction noted. The PDA and PLA branches are patent. The remainder of the RCA is smooth throughout its course.  Left ventriculography: There is mild hypokinesis of the inferior wall. The remaining LV wall segments are normal. The estimated LVEF is 55%.  Final  Conclusions:   1. Continued patency of the RCA stent with mild diffuse in-stent restenosis 2. No significant obstructive disease the left main, LAD, left circumflex. 3. Mild contraction abnormality of the LV with preserved overall LV systolic function.  Recommendations: Continue medical therapy and risk reduction measures.  Sherren Mocha 03/29/2014, 12:55 PM

## 2014-03-29 NOTE — Progress Notes (Signed)
Pt arrived from cath lab and there was a small proximal hematoma on his right wrist.  Dr cooper instructed for pressure to be applied for 5 minutes.  Area now feels soft.  Charge Nurse Kenney Houseman confirmed that  The site felt appropriate.  Will continue to monitor closely

## 2014-04-20 ENCOUNTER — Other Ambulatory Visit: Payer: Self-pay | Admitting: Physician Assistant

## 2014-04-21 ENCOUNTER — Other Ambulatory Visit: Payer: Self-pay

## 2014-04-21 MED ORDER — ROSUVASTATIN CALCIUM 10 MG PO TABS: 10.0000 mg | ORAL_TABLET | Freq: Every day | ORAL | Status: DC

## 2014-04-21 MED ORDER — METOPROLOL SUCCINATE ER 25 MG PO TB24: 25.0000 mg | ORAL_TABLET | Freq: Every day | ORAL | Status: DC

## 2014-04-24 ENCOUNTER — Ambulatory Visit (INDEPENDENT_AMBULATORY_CARE_PROVIDER_SITE_OTHER): Payer: Managed Care, Other (non HMO) | Admitting: Physician Assistant

## 2014-04-24 ENCOUNTER — Encounter: Payer: Self-pay | Admitting: Physician Assistant

## 2014-04-24 VITALS — BP 116/80 | HR 67 | Ht 67.0 in | Wt 159.8 lb

## 2014-04-24 DIAGNOSIS — E7800 Pure hypercholesterolemia, unspecified: Secondary | ICD-10-CM

## 2014-04-24 DIAGNOSIS — F17200 Nicotine dependence, unspecified, uncomplicated: Secondary | ICD-10-CM

## 2014-04-24 DIAGNOSIS — I2510 Atherosclerotic heart disease of native coronary artery without angina pectoris: Secondary | ICD-10-CM

## 2014-04-24 NOTE — Progress Notes (Addendum)
Cardiology Office Note   Date:  04/24/2014   ID:  [name], DOB [birth year], MRN [id number]  PCP:  Simona Huh, MD  Cardiologist:  Dr. Sherren Mocha      History of Present Illness: [name] is a 53 y.o. male with a hx of CAD, s/p remote inferior MI tx with stenting to the RCA, HL, tobacco abuse.  He was seen in the ED in [facility] 01/2014 for chest pain and syncope (son was missing for several hours and he was very upset). CEs were normal.  F/u Myoview was arranged and demonstrated apical inferior and mid inferior perfusion defect with partial reversibility, EF 52%.  Patient was set up for cardiac cath that demonstrated patent stent in the RCA and mild non-obstructive disease elsewhere.  Continued medical Rx was recommended.  He returns for follow up.  The patient denies any chest pain, significant dyspnea, syncope, orthopnea, PND, edema.  Studies:  - Echo (04/2006):  EF 55-60%  - Nuclear (03/14/14):  Intermediate risk, EF 52%, apical inferior and mid inferior perfusion defect with partial reversibility.  - LHC (03/29/14): OM1 20-30%, proximal RCA stent 30-40% ISR, EF 55% with inferior hypokinesis (mild). >>> Med Rx   Recent Labs: 03/13/2014: HDL Cholesterol by NMR 39.80; LDL (calc) 67  03/16/2014: Creatinine 1.0; Hemoglobin 15.3; Potassium 4.2 02/20/2014:  Hgb 15.2, potassium 4.5, creatinine 0.88, ALT 21   Wt Readings from Last 3 Encounters:  04/24/14 159 lb 12.8 oz (72.485 kg)  03/29/14 160 lb (72.576 kg)  03/29/14 160 lb (72.576 kg)     Past Medical History  Diagnosis Date  . Hypertension   . Hyperlipidemia   . History of heart attack     seen by Dr Lia Foyer  . Coronary artery disease   . Myocardial infarction 2005-2006?    stent 07  . Arthritis     Current Outpatient Prescriptions  Medication Sig Dispense Refill  . aspirin 81 MG tablet Take 81 mg by mouth daily.       . Coenzyme Q10 (CO Q 10) 100 MG CAPS Take 100 mg by mouth daily.      . metoprolol  succinate (TOPROL-XL) 25 MG 24 hr tablet Take 1 tablet (25 mg total) by mouth daily.  90 tablet  3  . nitroGLYCERIN (NITROSTAT) 0.4 MG SL tablet Place 0.4 mg under the tongue every 5 (five) minutes as needed. DISSOLVE 1 TAB UNDER TONGUE EVERY 5 MINUTES AS NEEDED FOR CHEST PAIN MAY REPEAT 3 TIMES      . Omega-3 Fatty Acids (FISH OIL) 1000 MG CAPS Take 3 capsules by mouth daily.       Marland Kitchen PROAIR HFA 108 (90 BASE) MCG/ACT inhaler Inhale 1 puff into the lungs every 4 (four) hours.      . rosuvastatin (CRESTOR) 10 MG tablet Take 1 tablet (10 mg total) by mouth daily.  90 tablet  3   No current facility-administered medications for this visit.    Allergies:   Percocet   Social History:  The patient  reports that he has been smoking.  He has never used smokeless tobacco. He reports that he does not drink alcohol or use illicit drugs.   Family History:  The patient's family history includes Aneurysm in his father; Cancer in his maternal uncle; Heart attack in his mother; Heart disease in his mother.   ROS:  Please see the history of present illness.      All other systems reviewed and negative.  PHYSICAL EXAM: VS:  BP 116/80  Pulse 67  Ht 5\' 7"  (1.702 m)  Wt 159 lb 12.8 oz (72.485 kg)  BMI 25.02 kg/m2 Well nourished, well developed, in no acute distress HEENT: normal Neck: no JVD Cardiac:  normal S1, S2; RRR; no murmur Lungs:  clear to auscultation bilaterally, no wheezing, rhonchi or rales Abd: soft, nontender, no hepatomegaly Ext: no edemaright wrist without hematoma or mass  Skin: warm and dry Neuro:  CNs 2-12 intact, no focal abnormalities noted  EKG:  NSR, HR 67, normal axis, no ST changes  ASSESSMENT AND PLAN:  1. Coronary Artery Disease, s/p remote Inferior MI tx with stent to RCA:   Recent LHC with patent RCA stent and no significant CAD elsewhere.  Continue medical Rx with aspirin, beta blocker, statin. 2. HYPERCHOLESTEROLEMIA:   Recent LDL optimal.  Continue  statin. 3. CIGARETTE SMOKER:  Cessation has again been recommended.  4. Disposition:  F/u with Dr. Sherren Mocha in 1 year.       Signed, Versie Starks, MHS 04/24/2014 12:56 PM    [facility] [place], [place],   [address] Phone: [phone]; Fax: [phone]

## 2014-04-24 NOTE — Patient Instructions (Addendum)
Continue to try to quit smoking.  Schedule follow up with Dr. Sherren Mocha in 1 year.  The office will send you a card 2-3 months before you need to schedule this appointment.

## 2014-09-07 ENCOUNTER — Encounter (HOSPITAL_COMMUNITY): Payer: Self-pay | Admitting: Cardiovascular Disease

## 2015-05-09 ENCOUNTER — Other Ambulatory Visit: Payer: Self-pay | Admitting: Cardiovascular Disease

## 2015-05-11 ENCOUNTER — Other Ambulatory Visit: Payer: Self-pay

## 2015-05-11 MED ORDER — ROSUVASTATIN CALCIUM 10 MG PO TABS: 10.0000 mg | ORAL_TABLET | Freq: Every day | ORAL | Status: DC

## 2015-06-27 ENCOUNTER — Ambulatory Visit (INDEPENDENT_AMBULATORY_CARE_PROVIDER_SITE_OTHER): Payer: Managed Care, Other (non HMO) | Admitting: Cardiovascular Disease

## 2015-06-27 ENCOUNTER — Encounter: Payer: Self-pay | Admitting: Cardiovascular Disease

## 2015-06-27 VITALS — BP 116/70 | HR 66 | Ht 67.0 in | Wt 154.8 lb

## 2015-06-27 DIAGNOSIS — I25810 Atherosclerosis of coronary artery bypass graft(s) without angina pectoris: Secondary | ICD-10-CM | POA: Diagnosis not present

## 2015-06-27 DIAGNOSIS — E7800 Pure hypercholesterolemia, unspecified: Secondary | ICD-10-CM

## 2015-06-27 NOTE — Progress Notes (Signed)
Cardiology Office Note Date:  06/27/2015   ID:  [name], DOB 05-04-1961, MRN [id number]  PCP:  Simona Huh, MD  Cardiologist:  Sherren Mocha, MD    Chief Complaint  Patient presents with  . Coronary Artery Disease     History of Present Illness: [name] is a 54 y.o. male who presents for follow-up of coronary artery disease. The patient initially presented with an inferior wall MI treated with stenting of the RCA. He developed recurrence of chest pain one year ago and nuclear scan demonstrated an inferior perfusion defect with partial reversibility, LVEF 52%. Cardiac catheterization demonstrated patency of his stent site with only mild in-stent restenosis and no significant coronary obstruction elsewhere. Medical therapy was recommended.  The patient presents today for follow-up evaluation. He had a mechanical fall earlier in the day and landed on his right ribs. He was evaluated at urgent care. He's having pleuritic right-sided chest pain. He otherwise is doing fine. He denies substernal pain or pressure, dyspnea, orthopnea, or PND. He continues to smoke cigarettes up to 1 pack per day.   Past Medical History  Diagnosis Date  . Hypertension   . Hyperlipidemia   . History of heart attack     seen by Dr Lia Foyer  . Coronary artery disease   . Myocardial infarction 2005-2006?    stent 07  . Arthritis     Past Surgical History  Procedure Laterality Date  . Neck surgery  2009  . Elbow surgery      left elbow   . Bicep surgery       right bicep   . Shoulder arthroscopy    . Mass excision  09/11/2011    Procedure: EXCISION MASS;  Surgeon: Gayland Curry, MD;  Location: [place];  Service: General;  Laterality: Left;  excision of left groin subcutaenous mass  . Cardiac catheterization  2008,07  . Wrist arthroscopy with debridement  09/27/2012    Procedure: WRIST ARTHROSCOPY WITH DEBRIDEMENT;  Surgeon: Wynonia Sours, MD;  Location: [place];  Service: Orthopedics;  Laterality: Left;  LEFT WRIST ARTHROSCOPY DEBRIDEMENT  SHRINKAGE TRIANGULAR FIBROCARTILAGE COMPLEX/ LIGAMENT   . Left heart catheterization with coronary angiogram N/A 03/29/2014    Procedure: LEFT HEART CATHETERIZATION WITH CORONARY ANGIOGRAM;  Surgeon: Blane Ohara, MD;  Location: [facility] CATH LAB;  Service: Cardiovascular;  Laterality: N/A;    Current Outpatient Prescriptions  Medication Sig Dispense Refill  . aspirin 81 MG tablet Take 81 mg by mouth daily.     . Coenzyme Q10 (CO Q 10) 100 MG CAPS Take 100 mg by mouth daily.    . metoprolol succinate (TOPROL-XL) 25 MG 24 hr tablet TAKE 1 TABLET BY MOUTH DAILY 90 tablet 0  . nitroGLYCERIN (NITROSTAT) 0.4 MG SL tablet Place 0.4 mg under the tongue every 5 (five) minutes as needed. DISSOLVE 1 TAB UNDER TONGUE EVERY 5 MINUTES AS NEEDED FOR CHEST PAIN MAY REPEAT 3 TIMES    . Omega-3 Fatty Acids (FISH OIL) 1000 MG CAPS Take 3 capsules by mouth daily.     Marland Kitchen PROAIR HFA 108 (90 BASE) MCG/ACT inhaler Inhale 1 puff into the lungs every 4 (four) hours.    . rosuvastatin (CRESTOR) 10 MG tablet Take 1 tablet (10 mg total) by mouth daily. 90 tablet 0   No current facility-administered medications for this visit.    Allergies:   Percocet   Social History:  The patient  reports that  he has been smoking.  He has never used smokeless tobacco. He reports that he does not drink alcohol or use illicit drugs.   Family History:  The patient's  family history includes Aneurysm in his father; Cancer in his maternal uncle; Heart attack in his mother; Heart disease in his mother.    ROS:  Please see the history of present illness. All other systems are reviewed and negative.    PHYSICAL EXAM: VS:  BP 116/70 mmHg  Pulse 66  Ht 5\' 7"  (1.702 m)  Wt 154 lb 12.8 oz (70.217 kg)  BMI 24.24 kg/m2 , BMI Body mass index is 24.24 kg/(m^2). GEN: Well nourished, well developed, in no acute distress HEENT: normal Neck: no JVD, no  masses. No carotid bruits Cardiac: RRR without murmur or gallop                Respiratory:  clear to auscultation bilaterally, normal work of breathing GI: soft, nontender, nondistended, + BS MS: no deformity or atrophy Ext: no pretibial edema, pedal pulses 2+= bilaterally Skin: warm and dry, no rash Neuro:  Strength and sensation are intact Psych: euthymic mood, full affect  EKG:  EKG is ordered today. The ekg ordered today shows normal sinus rhythm 66 bpm, within normal limits.  Recent Labs: No results found for requested labs within last 365 days.   Lipid Panel     Component Value Date/Time   CHOL 130 03/13/2014 0911   TRIG 117.0 03/13/2014 0911   HDL 39.80 03/13/2014 0911   CHOLHDL 3 03/13/2014 0911   VLDL 23.4 03/13/2014 0911   LDLCALC 67 03/13/2014 0911   LDLDIRECT 141.1 09/10/2009 1037      Wt Readings from Last 3 Encounters:  06/27/15 154 lb 12.8 oz (70.217 kg)  04/24/14 159 lb 12.8 oz (72.485 kg)  03/29/14 160 lb (72.576 kg)     Cardiac Studies Reviewed: - Nuclear (03/14/14): Intermediate risk, EF 52%, apical inferior and mid inferior perfusion defect with partial reversibility.  Cardiac Cath 03/29/2014: Procedural Findings: Hemodynamics: AO 97/58 with a mean of 77 LV 98/7  Coronary angiography: Coronary dominance: right  Left mainstem: The left main is patent. There is no obstructive disease the  Left anterior descending (LAD): The LAD is a large vessel. There is no obstructive disease noted. There is minor diffuse calcification. The diagonal branches are patent.  Left circumflex (LCx): The left circumflex is patent. The first OM branch is patent with mild 20-30% stenosis. The AV circumflex beyond the OM is small without significant disease.  Right coronary artery (RCA): The RCA is dominant. There is a long stent in the proximal vessel. The stent has 30-40% diffuse mid in-stent restenosis. There is no high-grade obstruction noted. The PDA and PLA  branches are patent. The remainder of the RCA is smooth throughout its course.  Left ventriculography: There is mild hypokinesis of the inferior wall. The remaining LV wall segments are normal. The estimated LVEF is 55%.  Final Conclusions:  1. Continued patency of the RCA stent with mild diffuse in-stent restenosis 2. No significant obstructive disease the left main, LAD, left circumflex. 3. Mild contraction abnormality of the LV with preserved overall LV systolic function.  Recommendations: Continue medical therapy and risk reduction measures.  ASSESSMENT AND PLAN: 1.  CAD, native vessel, with old MI: no angina. He's on appropriate Rx with ASA, a beta-blocker, and a statin drug. FU one year.  2. Hypercholesterolemia: last lipids reviewed. Due for repeat labs. Continue crestor.  3.  Tobacco abuse: spent extensive time today discussing need for tobacco cessation. Discussed options of nicotine patches, chantix, quitting cold [place]. He will think things over but seems motivated to quit.  Current medicines are reviewed with the patient today.  The patient does not have concerns regarding medicines.  Labs/ tests ordered today include:   Orders Placed This Encounter  Procedures  . Lipid Profile  . Hepatic function panel  . EKG 12-Lead    Disposition:   FU one year  Signed, Sherren Mocha, MD  06/27/2015 2:08 PM    [facility] [place], [place], [place]  [address] Phone: [phone]; Fax: [phone]

## 2015-06-27 NOTE — Patient Instructions (Signed)
Medication Instructions:  None  Labwork: Your physician recommends that you return for lab work when convenient for you (lipid, liver)  Testing/Procedures: None  Follow-Up: Your physician wants you to follow-up in: 1 year with Dr. Burt Knack. You will receive a reminder letter in the mail two months in advance. If you don't receive a letter, please call our office to schedule the follow-up appointment.   Any Other Special Instructions Will Be Listed Below (If Applicable).

## 2015-07-23 ENCOUNTER — Other Ambulatory Visit: Payer: Self-pay | Admitting: Cardiovascular Disease

## 2015-07-23 ENCOUNTER — Other Ambulatory Visit (INDEPENDENT_AMBULATORY_CARE_PROVIDER_SITE_OTHER): Payer: Managed Care, Other (non HMO) | Admitting: *Deleted

## 2015-07-23 DIAGNOSIS — E7800 Pure hypercholesterolemia, unspecified: Secondary | ICD-10-CM

## 2015-07-23 NOTE — Addendum Note (Signed)
Addended by: Eulis Foster on: 07/23/2015 07:34 AM   Modules accepted: Orders

## 2015-07-24 LAB — HEPATIC FUNCTION PANEL
ALT: 20 U/L (ref 9–46)
AST: 21 U/L (ref 10–35)
Albumin: 3.8 g/dL (ref 3.6–5.1)
Alkaline Phosphatase: 62 U/L (ref 40–115)
BILIRUBIN INDIRECT: 0.2 mg/dL (ref 0.2–1.2)
Bilirubin, Direct: 0.1 mg/dL (ref <=0.2)
Total Bilirubin: 0.3 mg/dL (ref 0.2–1.2)
Total Protein: 6 g/dL — ABNORMAL LOW (ref 6.1–8.1)

## 2015-07-24 LAB — LIPID PANEL
Cholesterol: 98 mg/dL — ABNORMAL LOW (ref 125–200)
HDL: 32 mg/dL — ABNORMAL LOW (ref >=40)
LDL Cholesterol: 55 mg/dL (ref <130)
TRIGLYCERIDES: 53 mg/dL (ref <150)
Total CHOL/HDL Ratio: 3.1 Ratio (ref <=5.0)
VLDL: 11 mg/dL (ref <30)

## 2015-07-27 ENCOUNTER — Telehealth: Payer: Self-pay | Admitting: Cardiovascular Disease

## 2015-07-27 NOTE — Telephone Encounter (Signed)
Called patient with lab results. Per Dr. Burt Knack Lipids are at goal and LFT's in normal range. Patient verbalized understanding.  Notes Recorded by Sherren Mocha, MD on 07/26/2015 at 6:16 AM Lipids at goal, LFT's in normal range

## 2015-07-27 NOTE — Telephone Encounter (Signed)
Follow Up  Pt calling following up on most recent lab results. Please call.

## 2015-08-02 ENCOUNTER — Other Ambulatory Visit: Payer: Self-pay | Admitting: Cardiovascular Disease

## 2015-08-02 MED ORDER — METOPROLOL SUCCINATE ER 25 MG PO TB24: 25.0000 mg | ORAL_TABLET | Freq: Every day | ORAL | Status: DC

## 2015-08-02 MED ORDER — ROSUVASTATIN CALCIUM 10 MG PO TABS: 10.0000 mg | ORAL_TABLET | Freq: Every day | ORAL | Status: DC

## 2015-09-11 ENCOUNTER — Telehealth: Payer: Self-pay | Admitting: Cardiovascular Disease

## 2015-09-11 NOTE — Telephone Encounter (Signed)
Reviewed with Truitt Merle, NP--  Per Lori--probably not cardiac, symptoms not like  symptoms at time of MI,  can try NTG. Should see PCP/Orthopaedic for further evaluation.   LMTCB for pt.

## 2015-09-11 NOTE — Telephone Encounter (Signed)
Returned call. No answer.  

## 2015-09-11 NOTE — Telephone Encounter (Signed)
Pt states for about a month he has had pain from his left shoulder to his bicep for about a month.  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  Pt states he can get the pain at any time, at rest or with exertion. Pt denies any other symptoms when he gets the shoulder pain, including chest pain, SOB, or sweats.  Pt states for about a week he has had numbness in his left hand that feels like he slept on it.  Pt has a history of  cervical disc problems and surgery on in left wrist in the past, not recently.

## 2015-09-11 NOTE — Telephone Encounter (Signed)
Pt advised, verbalized understanding. Pt will follow up with PCP.

## 2015-09-11 NOTE — Telephone Encounter (Signed)
New message      Pt has been having chronic pain in left arm/shoulder  for about 1 month.  Now he is getting numbness in his hand.  Is this something Dr Burt Knack need to address or should he see someone else?

## 2015-11-09 ENCOUNTER — Ambulatory Visit
Admission: RE | Admit: 2015-11-09 | Discharge: 2015-11-09 | Disposition: A | Discharge status: Home / Self Care | Payer: Managed Care, Other (non HMO) | Source: Ambulatory Visit | Attending: Family Medicine | Admitting: Family Medicine

## 2015-11-09 ENCOUNTER — Other Ambulatory Visit: Payer: Self-pay | Admitting: Family Medicine

## 2015-11-09 DIAGNOSIS — M545 Low back pain: Secondary | ICD-10-CM

## 2016-04-16 ENCOUNTER — Emergency Department (HOSPITAL_COMMUNITY): Payer: Managed Care, Other (non HMO)

## 2016-04-16 ENCOUNTER — Emergency Department (HOSPITAL_COMMUNITY)
Admission: EM | Admit: 2016-04-16 | Discharge: 2016-04-17 | Disposition: A | Discharge status: Home / Self Care | Payer: Managed Care, Other (non HMO) | Attending: Emergency Medicine | Admitting: Emergency Medicine

## 2016-04-16 ENCOUNTER — Encounter (HOSPITAL_COMMUNITY): Payer: Self-pay | Admitting: Emergency Medicine

## 2016-04-16 DIAGNOSIS — R079 Chest pain, unspecified: Secondary | ICD-10-CM | POA: Diagnosis present

## 2016-04-16 DIAGNOSIS — I10 Essential (primary) hypertension: Secondary | ICD-10-CM | POA: Insufficient documentation

## 2016-04-16 DIAGNOSIS — G5602 Carpal tunnel syndrome, left upper limb: Secondary | ICD-10-CM | POA: Diagnosis not present

## 2016-04-16 DIAGNOSIS — I2510 Atherosclerotic heart disease of native coronary artery without angina pectoris: Secondary | ICD-10-CM | POA: Insufficient documentation

## 2016-04-16 DIAGNOSIS — Z7982 Long term (current) use of aspirin: Secondary | ICD-10-CM | POA: Insufficient documentation

## 2016-04-16 DIAGNOSIS — I252 Old myocardial infarction: Secondary | ICD-10-CM | POA: Diagnosis not present

## 2016-04-16 DIAGNOSIS — Z87891 Personal history of nicotine dependence: Secondary | ICD-10-CM | POA: Diagnosis not present

## 2016-04-16 LAB — BASIC METABOLIC PANEL
Anion gap: 5 (ref 5–15)
BUN: 15 mg/dL (ref 6–20)
CHLORIDE: 109 mmol/L (ref 101–111)
CO2: 26 mmol/L (ref 22–32)
Calcium: 9.4 mg/dL (ref 8.9–10.3)
Creatinine, Ser: 0.92 mg/dL (ref 0.61–1.24)
GFR calc Af Amer: >60 mL/min (ref >60)
GFR calc non Af Amer: >60 mL/min (ref >60)
GLUCOSE: 115 mg/dL — AB (ref 65–99)
POTASSIUM: 4 mmol/L (ref 3.5–5.1)
Sodium: 140 mmol/L (ref 135–145)

## 2016-04-16 LAB — I-STAT TROPONIN, ED: Troponin i, poc: 0 ng/mL (ref 0.00–0.08)

## 2016-04-16 LAB — CBC
HEMATOCRIT: 42.7 % (ref 39.0–52.0)
Hemoglobin: 14.4 g/dL (ref 13.0–17.0)
MCH: 30.9 pg (ref 26.0–34.0)
MCHC: 33.7 g/dL (ref 30.0–36.0)
MCV: 91.6 fL (ref 78.0–100.0)
Platelets: 169 10*3/uL (ref 150–400)
RBC: 4.66 MIL/uL (ref 4.22–5.81)
RDW: 13.2 % (ref 11.5–15.5)
WBC: 5.7 10*3/uL (ref 4.0–10.5)

## 2016-04-16 NOTE — ED Provider Notes (Signed)
CSN: [id number]     Arrival date & time 04/16/16  2059 History   First MD Initiated Contact with Patient 04/16/16 2226     Chief Complaint  Patient presents with  . Chest Pain      Patient is a 55 y.o. male presenting with chest pain. The history is provided by the patient.  Chest Pain Associated symptoms: shortness of breath   Associated symptoms: no abdominal pain, no back pain, no headache, no nausea, no numbness, not vomiting and no weakness   Patient presents with pain in his left arm and left hand. States been going on at work sometimes and sometimes night. Occasional shortness of breath. States he did have an episode of left sided sharp lower chest pain today. Came on at rest. Does not feel like his previous MI. No fevers or chills. Does have occasional shortness of breath. Pain does not come on with exertion. States he does have some tingling in his left hand. Sometimes At Night and Has To Shake It out. Previously Had Surgery on the Wrist by Dr. Fredna Dow. States He Also Previous 8 Hours Bicep. Does Occasionally Have Some Pain in His Left Bicep Also.   Past Medical History  Diagnosis Date  . Hypertension   . Hyperlipidemia   . History of heart attack     seen by Dr Lia Foyer  . Coronary artery disease   . Myocardial infarction [facility]) 2005-2006?    stent 07  . Arthritis    Past Surgical History  Procedure Laterality Date  . Neck surgery  2009  . Elbow surgery      left elbow   . Bicep surgery       right bicep   . Shoulder arthroscopy    . Mass excision  09/11/2011    Procedure: EXCISION MASS;  Surgeon: Gayland Curry, MD;  Location: [place];  Service: General;  Laterality: Left;  excision of left groin subcutaenous mass  . Cardiac catheterization  2008,07  . Wrist arthroscopy with debridement  09/27/2012    Procedure: WRIST ARTHROSCOPY WITH DEBRIDEMENT;  Surgeon: Wynonia Sours, MD;  Location: [place];  Service: Orthopedics;  Laterality: Left;   LEFT WRIST ARTHROSCOPY DEBRIDEMENT  SHRINKAGE TRIANGULAR FIBROCARTILAGE COMPLEX/ LIGAMENT   . Left heart catheterization with coronary angiogram N/A 03/29/2014    Procedure: LEFT HEART CATHETERIZATION WITH CORONARY ANGIOGRAM;  Surgeon: Blane Ohara, MD;  Location: [facility] CATH LAB;  Service: Cardiovascular;  Laterality: N/A;   Family History  Problem Relation Age of Onset  . Heart attack Mother   . Heart disease Mother   . Aneurysm Father   . Cancer Maternal Uncle     lung   Social History  Substance Use Topics  . Smoking status: Former Smoker -- 1.00 packs/day    Quit date: 10/31/2015  . Smokeless tobacco: Never Used  . Alcohol Use: No    Review of Systems  Constitutional: Negative for activity change and appetite change.  Eyes: Negative for pain.  Respiratory: Positive for shortness of breath. Negative for chest tightness.   Cardiovascular: Positive for chest pain. Negative for leg swelling.  Gastrointestinal: Negative for nausea, vomiting, abdominal pain and diarrhea.  Genitourinary: Negative for flank pain.  Musculoskeletal: Negative for back pain and neck stiffness.  Skin: Negative for rash.  Neurological: Negative for weakness, numbness and headaches.  Psychiatric/Behavioral: Negative for behavioral problems.      Allergies  Percocet  Home Medications   Prior to Admission  medications   Medication Sig Start Date End Date Taking? Authorizing Provider  aspirin 81 MG tablet Take 81 mg by mouth daily.    Yes Historical Provider, MD  metoprolol succinate (TOPROL-XL) 25 MG 24 hr tablet Take 1 tablet (25 mg total) by mouth daily. 08/02/15  Yes Sherren Mocha, MD  Omega-3 Fatty Acids (FISH OIL) 1000 MG CAPS Take 3 capsules by mouth daily.    Yes Historical Provider, MD  PROAIR HFA 108 (90 BASE) MCG/ACT inhaler Inhale 1 puff into the lungs every 6 (six) hours as needed for shortness of breath. Reported on 04/16/2016 03/09/14  Yes Historical Provider, MD  rosuvastatin (CRESTOR) 10  MG tablet Take 1 tablet (10 mg total) by mouth daily. 08/02/15  Yes Sherren Mocha, MD  nitroGLYCERIN (NITROSTAT) 0.4 MG SL tablet Place 0.4 mg under the tongue every 5 (five) minutes as needed. DISSOLVE 1 TAB UNDER TONGUE EVERY 5 MINUTES AS NEEDED FOR CHEST PAIN MAY REPEAT 3 TIMES 02/27/14   Liliane Shi, PA-C   BP 105/74 mmHg  Pulse 55  Temp(Src) 97.8 F (36.6 C) (Oral)  Resp 18  Ht 5\' 6"  (1.676 m)  Wt 160 lb (72.576 kg)  BMI 25.84 kg/m2  SpO2 97% Physical Exam  Constitutional: He appears well-developed and well-nourished.  HENT:  Head: Atraumatic.  Eyes: EOM are normal.  Neck: Neck supple.  Cardiovascular: Normal rate.   Pulmonary/Chest: Effort normal.  Abdominal: Soft.  Musculoskeletal: Normal range of motion.  Neurological: He is alert.  Sensation intact in bilateral hands. Sensation intact in radial median and ulnar distribution. Strong radial pulse. Scar on left forearm from previous bicep surgery.  Skin: Skin is warm.  Psychiatric: He has a normal mood and affect.    ED Course  Procedures (including critical care time) Labs Review Labs Reviewed  BASIC METABOLIC PANEL - Abnormal; Notable for the following:    Glucose, Bld 115 (*)    All other components within normal limits  CBC  I-STAT TROPOININ, ED  Randolm Idol, ED    Imaging Review Dg Chest 2 View  04/16/2016  CLINICAL DATA:  Chest pain for 3 days EXAM: CHEST  2 VIEW COMPARISON:  02/28/2009 FINDINGS: Normal heart size and mediastinal contours. No acute infiltrate or edema. Subtle rounded density overlapping the anterior right sixth rib is likely a nipple shadow or EKG pad. No effusion or pneumothorax. No acute osseous findings. IMPRESSION: No active cardiopulmonary disease. Electronically Signed   By: Monte Fantasia M.D.   On: 04/16/2016 22:07   I have personally reviewed and evaluated these images and lab results as part of my medical decision-making.   EKG Interpretation   Date/Time:  Wednesday April 16 2016 21:15:57 EDT Ventricular Rate:  60 PR Interval:  184 QRS Duration: 102 QT Interval:  404 QTC Calculation: 404 R Axis:   84 Text Interpretation:  Normal sinus rhythm Normal ECG Confirmed by  Alvino Chapel  MD, Ovid Curd [phone]) on 04/16/2016 10:36:07 PM      MDM   Final diagnoses:  Chest pain, unspecified chest pain type  Carpal tunnel syndrome of left wrist    Patient with chest pain. Sharp left-sided. Not like previous angina. Reviewed previous heart catheterizations. Doubt this is cardiac cause. Bones negative 2. Will discharge home. Also has left hand numbness tingling. Maybe if component of carpal tunnel. We'll also discharge.    Davonna Belling, MD 04/17/16 [phone]

## 2016-04-16 NOTE — ED Notes (Signed)
Pt here with intermittent pains to left arm for several days. Tonight at church he had a sudden sharp pain in left chest. Pt also reports dizziness, diaphoresis, SOB.

## 2016-04-16 NOTE — Discharge Instructions (Signed)
Carpal Tunnel Syndrome Carpal tunnel syndrome is a condition that causes pain in your hand and arm. The carpal tunnel is a narrow area located on the palm side of your wrist. Repeated wrist motion or certain diseases may cause swelling within the tunnel. This swelling pinches the main nerve in the wrist (median nerve). CAUSES  This condition may be caused by:   Repeated wrist motions.  Wrist injuries.  Arthritis.  A cyst or tumor in the carpal tunnel.  Fluid buildup during pregnancy. Sometimes the cause of this condition is not known.  RISK FACTORS This condition is more likely to develop in:   People who have jobs that cause them to repeatedly move their wrists in the same motion, such as butchers and cashiers.  Women.  People with certain conditions, such as:  Diabetes.  Obesity.  An underactive thyroid (hypothyroidism).  Kidney failure. SYMPTOMS  Symptoms of this condition include:   A tingling feeling in your fingers, especially in your thumb, index, and middle fingers.  Tingling or numbness in your hand.  An aching feeling in your entire arm, especially when your wrist and elbow are bent for long periods of time.  Wrist pain that goes up your arm to your shoulder.  Pain that goes down into your palm or fingers.  A weak feeling in your hands. You may have trouble grabbing and holding items. Your symptoms may feel worse during the night.  DIAGNOSIS  This condition is diagnosed with a medical history and physical exam. You may also have tests, including:   An electromyogram (EMG). This test measures electrical signals sent by your nerves into the muscles.  X-rays. TREATMENT  Treatment for this condition includes:  Lifestyle changes. It is important to stop doing or modify the activity that caused your condition.  Physical or occupational therapy.  Medicines for pain and inflammation. This may include medicine that is injected into your wrist.  A wrist  splint.  Surgery. HOME CARE INSTRUCTIONS  If You Have a Splint:  Wear it as told by your health care provider. Remove it only as told by your health care provider.  Loosen the splint if your fingers become numb and tingle, or if they turn cold and blue.  Keep the splint clean and dry. General Instructions  Take over-the-counter and prescription medicines only as told by your health care provider.  Rest your wrist from any activity that may be causing your pain. If your condition is work related, talk to your employer about changes that can be made, such as getting a wrist pad to use while typing.  If directed, apply ice to the painful area:  Put ice in a plastic bag.  Place a towel between your skin and the bag.  Leave the ice on for 20 minutes, 2-3 times per day.  Keep all follow-up visits as told by your health care provider. This is important.  Do any exercises as told by your health care provider, physical therapist, or occupational therapist. [place] IF:   You have new symptoms.  Your pain is not controlled with medicines.  Your symptoms get worse.   This information is not intended to replace advice given to you by your health care provider. Make sure you discuss any questions you have with your health care provider.   Document Released: 09/12/2000 Document Revised: 06/06/2015 Document Reviewed: 01/31/2015 Elsevier Interactive Patient Education 2016 [employer]  Nonspecific Chest Pain  Chest pain can be caused by many different  conditions. There is always a chance that your pain could be related to something serious, such as a heart attack or a blood clot in your lungs. Chest pain can also be caused by conditions that are not life-threatening. If you have chest pain, it is very important to follow up with your health care provider. CAUSES  Chest pain can be caused by:  Heartburn.  Pneumonia or bronchitis.  Anxiety or stress.  Inflammation around  your heart (pericarditis) or lung (pleuritis or pleurisy).  A blood clot in your lung.  A collapsed lung (pneumothorax). It can develop suddenly on its own (spontaneous pneumothorax) or from trauma to the chest.  Shingles infection (varicella-zoster virus).  Heart attack.  Damage to the bones, muscles, and cartilage that make up your chest wall. This can include:  Bruised bones due to injury.  Strained muscles or cartilage due to frequent or repeated coughing or overwork.  Fracture to one or more ribs.  Sore cartilage due to inflammation (costochondritis). RISK FACTORS  Risk factors for chest pain may include:  Activities that increase your risk for trauma or injury to your chest.  Respiratory infections or conditions that cause frequent coughing.  Medical conditions or overeating that can cause heartburn.  Heart disease or family history of heart disease.  Conditions or health behaviors that increase your risk of developing a blood clot.  Having had chicken pox (varicella zoster). SIGNS AND SYMPTOMS Chest pain can feel like:  Burning or tingling on the surface of your chest or deep in your chest.  Crushing, pressure, aching, or squeezing pain.  Dull or sharp pain that is worse when you move, cough, or take a deep breath.  Pain that is also felt in your back, neck, shoulder, or arm, or pain that spreads to any of these areas. Your chest pain may come and go, or it may stay constant. DIAGNOSIS Lab tests or other studies may be needed to find the cause of your pain. Your health care provider may have you take a test called an ambulatory ECG (electrocardiogram). An ECG records your heartbeat patterns at the time the test is performed. You may also have other tests, such as:  Transthoracic echocardiogram (TTE). During echocardiography, sound waves are used to create a picture of all of the heart structures and to look at how blood flows through your  heart.  Transesophageal echocardiogram (TEE).This is a more advanced imaging test that obtains images from inside your body. It allows your health care provider to see your heart in finer detail.  Cardiac monitoring. This allows your health care provider to monitor your heart rate and rhythm in real time.  Holter monitor. This is a portable device that records your heartbeat and can help to diagnose abnormal heartbeats. It allows your health care provider to track your heart activity for several days, if needed.  Stress tests. These can be done through exercise or by taking medicine that makes your heart beat more quickly.  Blood tests.  Imaging tests. TREATMENT  Your treatment depends on what is causing your chest pain. Treatment may include:  Medicines. These may include:  Acid blockers for heartburn.  Anti-inflammatory medicine.  Pain medicine for inflammatory conditions.  Antibiotic medicine, if an infection is present.  Medicines to dissolve blood clots.  Medicines to treat coronary artery disease.  Supportive care for conditions that do not require medicines. This may include:  Resting.  Applying heat or cold packs to injured areas.  Limiting activities until pain  decreases. HOME CARE INSTRUCTIONS  If you were prescribed an antibiotic medicine, finish it all even if you start to feel better.  Avoid any activities that bring on chest pain.  Do not use any tobacco products, including cigarettes, chewing tobacco, or electronic cigarettes. If you need help quitting, ask your health care provider.  Do not drink alcohol.  Take medicines only as directed by your health care provider.  Keep all follow-up visits as directed by your health care provider. This is important. This includes any further testing if your chest pain does not go away.  If heartburn is the cause for your chest pain, you may be told to keep your head raised (elevated) while sleeping. This reduces  the chance that acid will go from your stomach into your esophagus.  Make lifestyle changes as directed by your health care provider. These may include:  Getting regular exercise. Ask your health care provider to suggest some activities that are safe for you.  Eating a heart-healthy diet. A registered dietitian can help you to learn healthy eating options.  Maintaining a healthy weight.  Managing diabetes, if necessary.  Reducing stress. SEEK MEDICAL CARE IF:  Your chest pain does not go away after treatment.  You have a rash with blisters on your chest.  You have a fever. SEEK IMMEDIATE MEDICAL CARE IF:   Your chest pain is worse.  You have an increasing cough, or you cough up blood.  You have severe abdominal pain.  You have severe weakness.  You faint.  You have chills.  You have sudden, unexplained chest discomfort.  You have sudden, unexplained discomfort in your arms, back, neck, or jaw.  You have shortness of breath at any time.  You suddenly start to sweat, or your skin gets clammy.  You feel nauseous or you vomit.  You suddenly feel light-headed or dizzy.  Your heart begins to beat quickly, or it feels like it is skipping beats. These symptoms may represent a serious problem that is an emergency. Do not wait to see if the symptoms will go away. Get medical help right away. Call your local emergency services (911 in the [place] Do not drive yourself to the hospital.   This information is not intended to replace advice given to you by your health care provider. Make sure you discuss any questions you have with your health care provider.   Document Released: 06/25/2005 Document Revised: 10/06/2014 Document Reviewed: 04/21/2014 Elsevier Interactive Patient Education [employer].

## 2016-04-17 LAB — I-STAT TROPONIN, ED: Troponin i, poc: 0 ng/mL (ref 0.00–0.08)

## 2016-06-14 IMAGING — CR DG LUMBAR SPINE 2-3V
3 series · 3 of 3 positions shown · non-contrast
Comparison: MRI of the lumbar spine December 20, 2014 and lumbar
spine series of October 06, 2014.

CLINICAL DATA: Low back pain without radicular symptoms. Duration
of symptoms several months.

EXAM:
LUMBAR SPINE - 2-3 VIEW

[t l-spine a.p.]
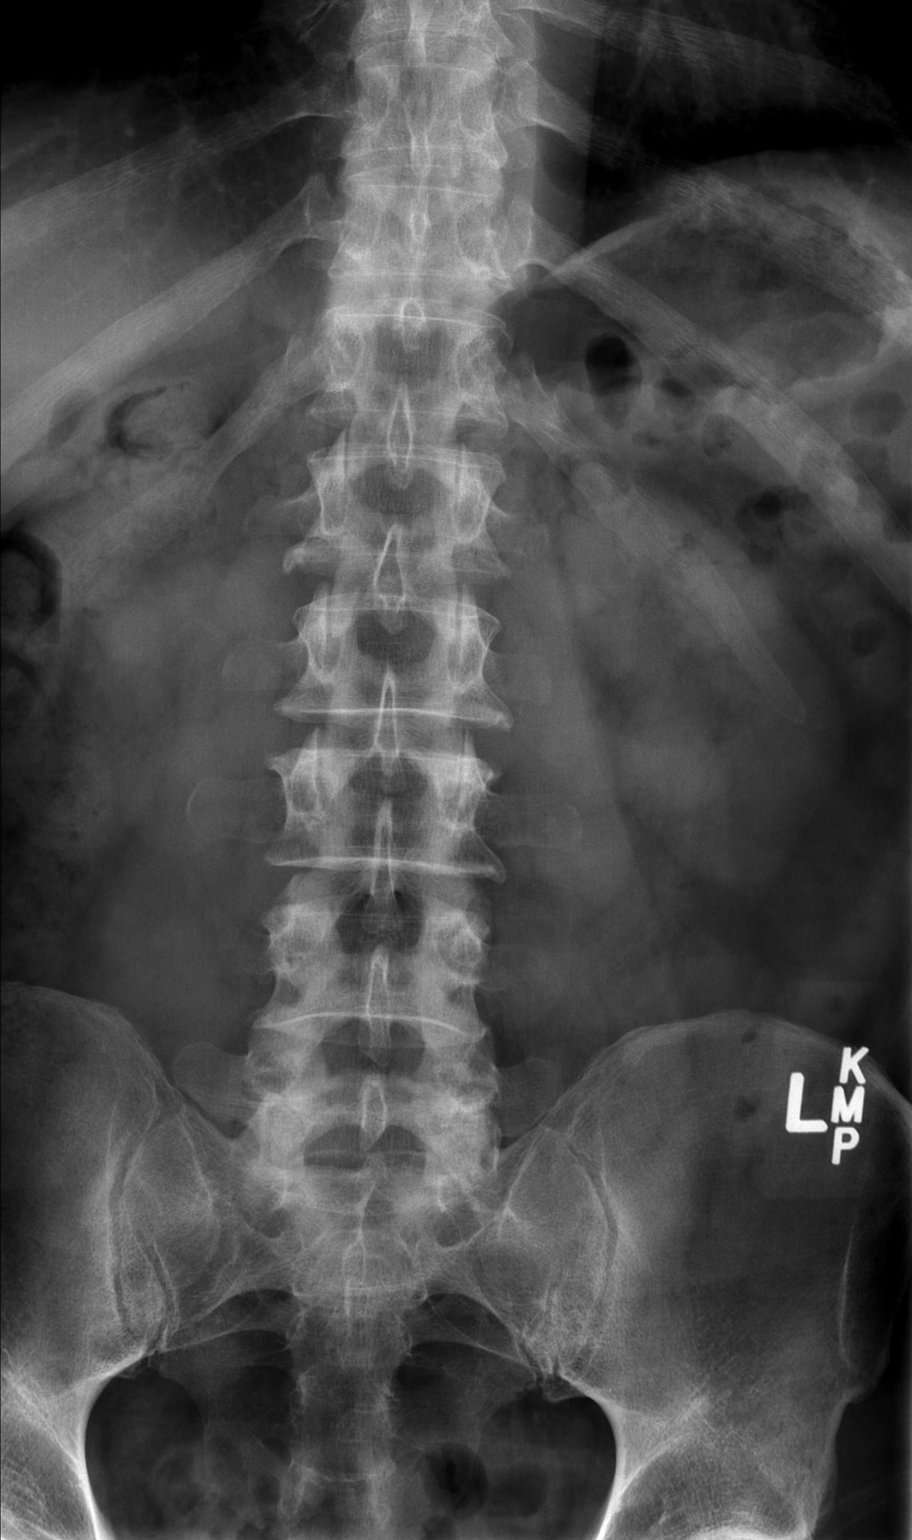

[t l-spine lat]
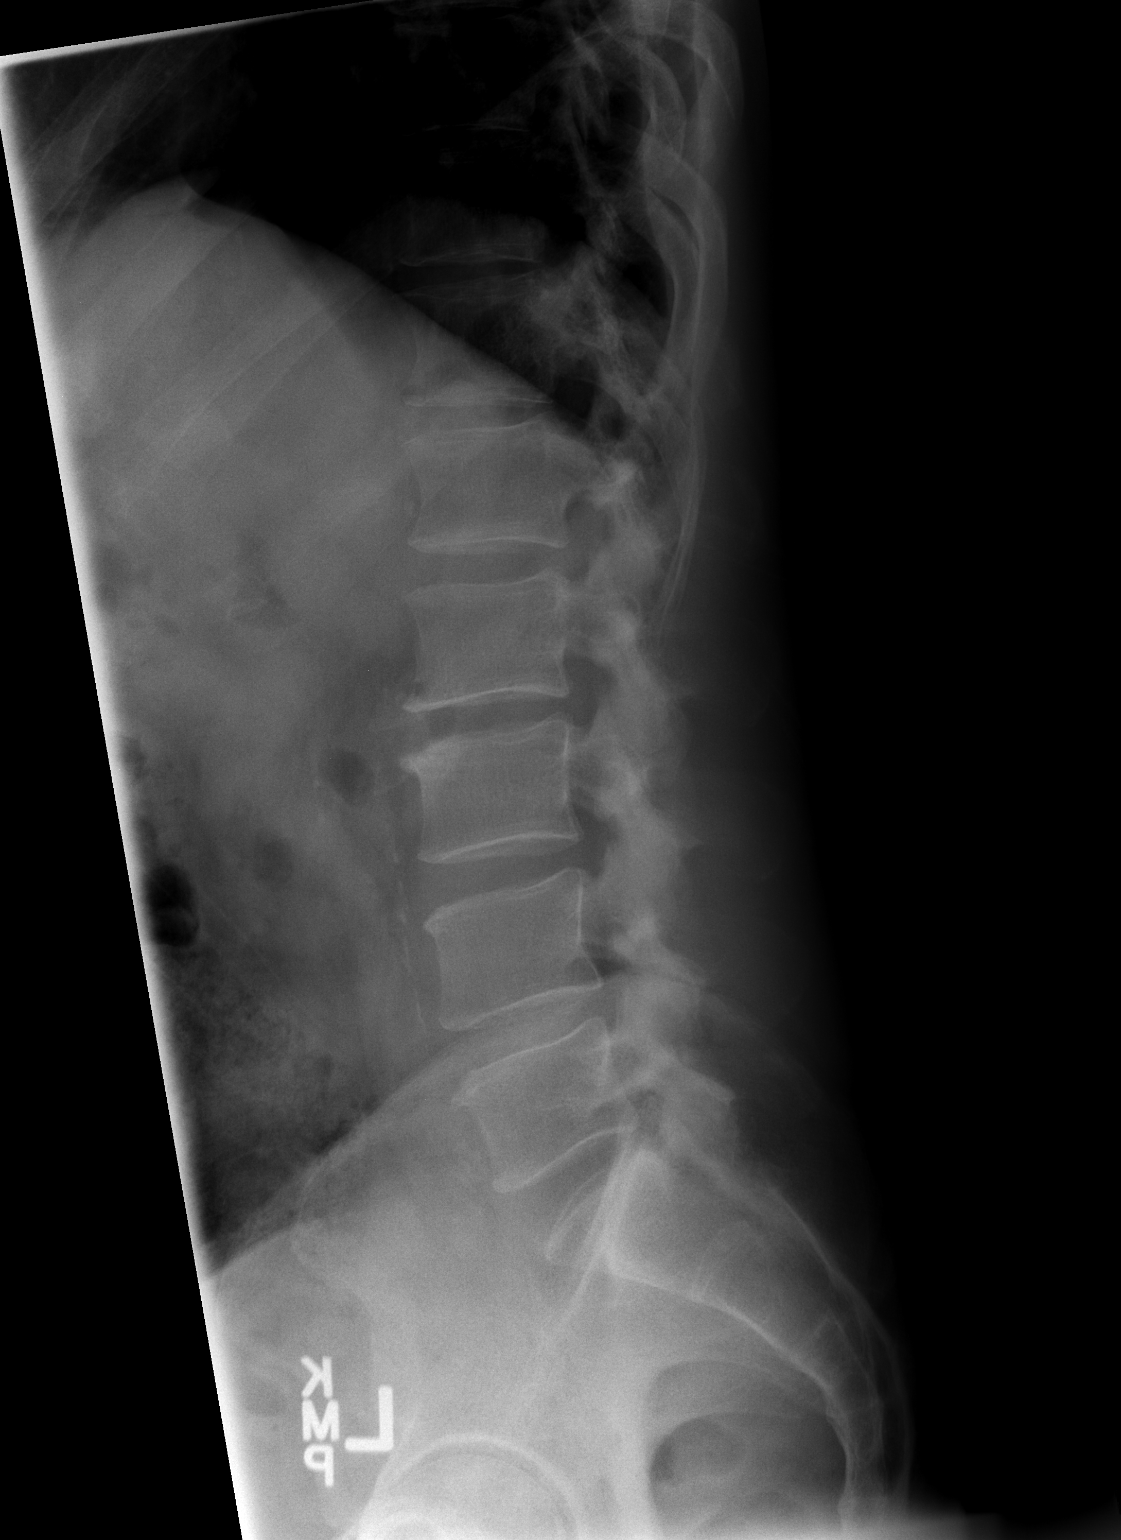

[t l-spine l5-s1 spot]
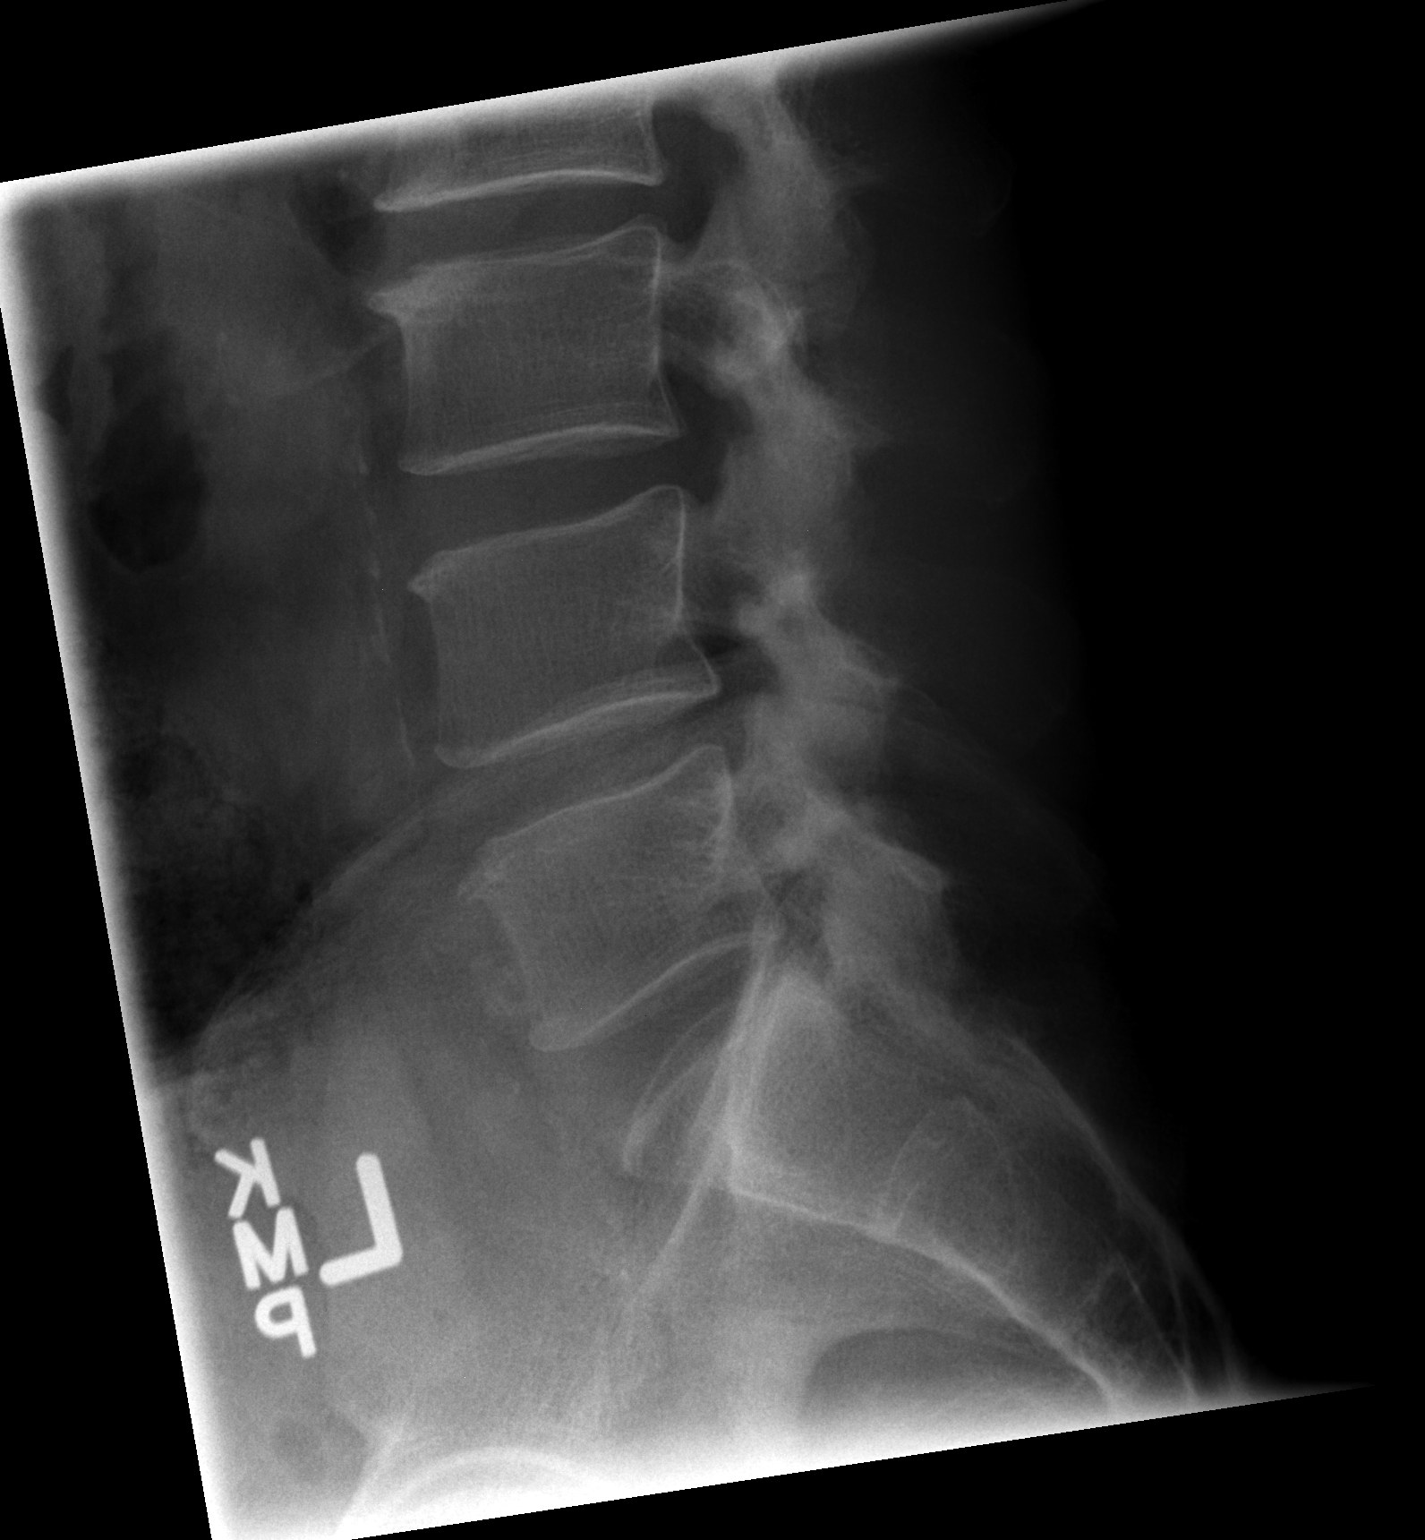

[3 of 3 positions shown; findings below may reference images not displayed]

FINDINGS: The lumbar vertebral bodies are preserved in height. There is mild
disc space narrowing at L2-3 which has been previously demonstrated.
Small anterior endplate osteophytes are noted at this level. There
is minimal disc space narrowing at L4-5 which is stable. The
pedicles and transverse processes are intact. There is no
spondylolisthesis. There is mild facet joint hypertrophy at L4-5 and
at L5-S1.
IMPRESSION: Persistent degenerative disc disease as described. No significant
progression or new finding since the previous study.

## 2016-07-21 ENCOUNTER — Other Ambulatory Visit: Payer: Self-pay | Admitting: Cardiovascular Disease

## 2016-08-18 ENCOUNTER — Ambulatory Visit: Payer: Managed Care, Other (non HMO) | Admitting: Cardiovascular Disease

## 2016-10-08 ENCOUNTER — Encounter: Payer: Self-pay | Admitting: Cardiovascular Disease

## 2016-10-08 ENCOUNTER — Ambulatory Visit (INDEPENDENT_AMBULATORY_CARE_PROVIDER_SITE_OTHER): Payer: Managed Care, Other (non HMO) | Admitting: Cardiovascular Disease

## 2016-10-08 VITALS — BP 132/80 | HR 88 | Ht 67.0 in | Wt 161.0 lb

## 2016-10-08 DIAGNOSIS — E7800 Pure hypercholesterolemia, unspecified: Secondary | ICD-10-CM | POA: Diagnosis not present

## 2016-10-08 DIAGNOSIS — I25810 Atherosclerosis of coronary artery bypass graft(s) without angina pectoris: Secondary | ICD-10-CM | POA: Diagnosis not present

## 2016-10-08 MED ORDER — ALBUTEROL SULFATE HFA 108 (90 BASE) MCG/ACT IN AERS: 1.0000 | INHALATION_SPRAY | Freq: Four times a day (QID) | RESPIRATORY_TRACT | 0 refills | Status: DC | PRN

## 2016-10-08 MED ORDER — NITROGLYCERIN 0.4 MG SL SUBL: 0.4000 mg | SUBLINGUAL_TABLET | SUBLINGUAL | 2 refills | Status: DC | PRN

## 2016-10-08 NOTE — Patient Instructions (Signed)
Medication Instructions:  Your physician recommends that you continue on your current medications as directed. Please refer to the Current Medication list given to you today.  Labwork: Your physician recommends that you return for a FASTING LIPID and LIVER--nothing to eat or drink after midnight, lab opens at 7:30 AM  Testing/Procedures: No new orders.   Follow-Up: Your physician wants you to follow-up in: 1 YEAR with Dr Burt Knack.  You will receive a reminder letter in the mail two months in advance. If you don't receive a letter, please call our office to schedule the follow-up appointment.   Any Other Special Instructions Will Be Listed Below (If Applicable).     If you need a refill on your cardiac medications before your next appointment, please call your pharmacy.

## 2016-10-08 NOTE — Progress Notes (Signed)
Cardiology Office Note Date:  10/08/2016   ID:  [name], DOB 04/11/1961, MRN [id number]  PCP:  Simona Huh, MD  Cardiologist:  Sherren Mocha, MD    Chief Complaint  Patient presents with  . Coronary Artery Disease     History of Present Illness: [name] is a 56 y.o. male who presents for follow-up of coronary artery disease. The patient initially presented with an inferior wall MI treated with stenting of the RCA. He developed recurrence of chest pain one year ago and nuclear scan demonstrated an inferior perfusion defect with partial reversibility, LVEF 52%. Cardiac catheterization demonstrated patency of his stent site with only mild in-stent restenosis and no significant coronary obstruction elsewhere. Medical therapy was recommended.  The patient is doing well. He is here alone today. He has not had any recent problems with chest pain, shortness of breath, leg swelling, or heart palpitations. He is compliant with his medications. He quit smoking cigarettes about 6 months ago.  Past Medical History:  Diagnosis Date  . Arthritis   . Coronary artery disease   . History of heart attack    seen by Dr Lia Foyer  . Hyperlipidemia   . Hypertension   . Myocardial infarction 2005-2006?   stent 07    Past Surgical History:  Procedure Laterality Date  . bicep surgery      right bicep   . CARDIAC CATHETERIZATION  2008,07  . ELBOW SURGERY     left elbow   . LEFT HEART CATHETERIZATION WITH CORONARY ANGIOGRAM N/A 03/29/2014   Procedure: LEFT HEART CATHETERIZATION WITH CORONARY ANGIOGRAM;  Surgeon: Blane Ohara, MD;  Location: [facility] CATH [facility];  Service: Cardiovascular;  Laterality: N/A;  . MASS EXCISION  09/11/2011   Procedure: EXCISION MASS;  Surgeon: Gayland Curry, MD;  Location: [facility];  Service: General;  Laterality: Left;  excision of left groin subcutaenous mass  . NECK SURGERY  2009  . SHOULDER ARTHROSCOPY    . WRIST ARTHROSCOPY WITH  DEBRIDEMENT  09/27/2012   Procedure: WRIST ARTHROSCOPY WITH DEBRIDEMENT;  Surgeon: Wynonia Sours, MD;  Location: [facility];  Service: Orthopedics;  Laterality: Left;  LEFT WRIST ARTHROSCOPY DEBRIDEMENT  SHRINKAGE TRIANGULAR FIBROCARTILAGE COMPLEX/ LIGAMENT     Current Outpatient Prescriptions  Medication Sig Dispense Refill  . albuterol (PROAIR HFA) 108 (90 Base) MCG/ACT inhaler Inhale 1 puff into the lungs every 6 (six) hours as needed for shortness of breath. Reported on 04/16/2016 1 Inhaler 0  . aspirin 81 MG tablet Take 81 mg by mouth daily.     . diclofenac (VOLTAREN) 75 MG EC tablet Take 75 mg by mouth 2 (two) times daily.    . metoprolol succinate (TOPROL-XL) 25 MG 24 hr tablet TAKE 1 TABLET BY MOUTH DAILY 90 tablet 0  . nitroGLYCERIN (NITROSTAT) 0.4 MG SL tablet Place 1 tablet (0.4 mg total) under the tongue every 5 (five) minutes as needed. DISSOLVE 1 TAB UNDER TONGUE EVERY 5 MINUTES AS NEEDED FOR CHEST PAIN MAY REPEAT 3 TIMES 25 tablet 2  . Omega-3 Fatty Acids (FISH OIL) 1000 MG CAPS Take 3 capsules by mouth daily.     . rosuvastatin (CRESTOR) 10 MG tablet TAKE 1 TABLET BY MOUTH DAILY 90 tablet 0   No current facility-administered medications for this visit.     Allergies:   Oxycodone-acetaminophen and Percocet [oxycodone-acetaminophen]   Social History:  The patient  reports that he quit smoking about 11 months ago. He smoked  1.00 pack per day. He has never used smokeless tobacco. He reports that he does not drink alcohol or use drugs.   Family History:  The patient's  family history includes Aneurysm in his father; Cancer in his maternal uncle; Heart attack in his mother; Heart disease in his mother.    ROS:  Please see the history of present illness.  Otherwise, review of systems is positive for Back pain, snoring, headaches.  All other systems are reviewed and negative.    PHYSICAL EXAM: VS:  BP 132/80   Pulse 88   Ht 5\' 7"  (1.702 m)   Wt 161 lb (73 kg)    BMI 25.22 kg/m  , BMI Body mass index is 25.22 kg/m. GEN: Well nourished, well developed, in no acute distress  HEENT: normal  Neck: no JVD, no masses. No carotid bruits Cardiac: RRR without murmur or gallop                Respiratory:  clear to auscultation bilaterally, normal work of breathing GI: soft, nontender, nondistended, + BS MS: no deformity or atrophy  Ext: no pretibial edema, pedal pulses 2+= bilaterally Skin: warm and dry, no rash Neuro:  Strength and sensation are intact Psych: euthymic mood, full affect  EKG:  EKG is ordered today. The ekg ordered today shows normal sinus rhythm 89 bpm, T-wave abnormality consider inferior ischemia  Recent Labs: 04/16/2016: BUN 15; Creatinine, Ser 0.92; Hemoglobin 14.4; Platelets 169; Potassium 4.0; Sodium 140   Lipid Panel     Component Value Date/Time   CHOL 98 (L) 07/23/2015 0734   TRIG 53 07/23/2015 0734   HDL 32 (L) 07/23/2015 0734   CHOLHDL 3.1 07/23/2015 0734   VLDL 11 07/23/2015 0734   LDLCALC 55 07/23/2015 0734   LDLDIRECT 141.1 09/10/2009 1037      Wt Readings from Last 3 Encounters:  10/08/16 161 lb (73 kg)  04/16/16 160 lb (72.6 kg)  06/27/15 154 lb 12.8 oz (70.2 kg)    ASSESSMENT AND PLAN: 1.  CAD, native vessel, without angina: Patient is stable on his medical program. He will continue on aspirin, beta blocker, and a statin drug. He has no anginal symptoms at a high workload.  2. Tobacco abuse: Quit 6 months ago. His efforts are plotted today.  3. Hyperlipidemia: Treated with Crestor 10 mg daily. He is overdue for lipids and LFTs and will schedule follow-up labs.  Current medicines are reviewed with the patient today.  The patient does not have concerns regarding medicines.  Labs/ tests ordered today include:   Orders Placed This Encounter  Procedures  . EKG 12-Lead    Disposition:   FU one year  Signed, Sherren Mocha, MD  10/08/2016 5:07 PM    [facility] [address], [place], [place]  [address] Phone: [phone]; Fax: [phone]

## 2016-10-10 ENCOUNTER — Other Ambulatory Visit: Payer: Managed Care, Other (non HMO) | Admitting: *Deleted

## 2016-10-10 DIAGNOSIS — E7800 Pure hypercholesterolemia, unspecified: Secondary | ICD-10-CM

## 2016-10-10 NOTE — Addendum Note (Signed)
Addended by: Eulis Foster on: 10/10/2016 08:48 AM   Modules accepted: Orders

## 2016-10-11 LAB — HEPATIC FUNCTION PANEL
ALBUMIN: 4.5 g/dL (ref 3.5–5.5)
ALK PHOS: 69 IU/L (ref 39–117)
ALT: 27 IU/L (ref 0–44)
AST: 23 IU/L (ref 0–40)
Bilirubin Total: 0.6 mg/dL (ref 0.0–1.2)
Bilirubin, Direct: 0.16 mg/dL (ref 0.00–0.40)
TOTAL PROTEIN: 6.4 g/dL (ref 6.0–8.5)

## 2016-10-11 LAB — LIPID PANEL
Chol/HDL Ratio: 3.2 ratio units (ref 0.0–5.0)
Cholesterol, Total: 131 mg/dL (ref 100–199)
HDL: 41 mg/dL (ref >39)
LDL CALC: 77 mg/dL (ref 0–99)
Triglycerides: 64 mg/dL (ref 0–149)
VLDL CHOLESTEROL CAL: 13 mg/dL (ref 5–40)

## 2016-11-16 ENCOUNTER — Other Ambulatory Visit: Payer: Self-pay | Admitting: Cardiovascular Disease

## 2017-06-18 ENCOUNTER — Other Ambulatory Visit: Payer: Self-pay | Admitting: Cardiovascular Disease

## 2017-06-24 MED ORDER — ALBUTEROL SULFATE HFA 108 (90 BASE) MCG/ACT IN AERS
1.0000 | INHALATION_SPRAY | Freq: Four times a day (QID) | RESPIRATORY_TRACT | 0 refills | Status: DC | PRN
Start: 2017-06-24 — End: 2018-05-08

## 2017-07-23 ENCOUNTER — Other Ambulatory Visit: Payer: Self-pay | Admitting: Cardiovascular Disease

## 2017-09-30 ENCOUNTER — Other Ambulatory Visit: Payer: Self-pay | Admitting: Cardiovascular Disease

## 2018-01-02 ENCOUNTER — Other Ambulatory Visit: Payer: Self-pay | Admitting: Cardiovascular Disease

## 2018-01-08 ENCOUNTER — Other Ambulatory Visit: Payer: Self-pay | Admitting: Cardiovascular Disease

## 2018-01-11 ENCOUNTER — Other Ambulatory Visit: Payer: Self-pay | Admitting: Cardiovascular Disease

## 2018-01-11 MED ORDER — ROSUVASTATIN CALCIUM 10 MG PO TABS: 10.0000 mg | ORAL_TABLET | Freq: Every day | ORAL | 0 refills | Status: DC

## 2018-01-11 MED ORDER — METOPROLOL SUCCINATE ER 25 MG PO TB24: 25.0000 mg | ORAL_TABLET | Freq: Every day | ORAL | 0 refills | Status: DC

## 2018-01-11 NOTE — Telephone Encounter (Signed)
New Message   *STAT* If patient is at the pharmacy, call can be transferred to refill team.   1. Which medications need to be refilled? (please list name of each medication and dose if known) metoprolol succinate (TOPROL-XL) 25 MG 24 hr tablet and rosuvastatin (CRESTOR) 10 MG tablet  2. Which pharmacy/location (including street and city if local pharmacy) is medication to be sent to? [place], [place]  3. Do they need a 30 day or 90 day supply? [place]

## 2018-01-11 NOTE — Telephone Encounter (Signed)
Pt's medication was sent to pt's pharmacy as requested. Confirmation received.  °

## 2018-01-12 ENCOUNTER — Other Ambulatory Visit: Payer: Self-pay | Admitting: *Deleted

## 2018-01-12 MED ORDER — ROSUVASTATIN CALCIUM 10 MG PO TABS: 10.0000 mg | ORAL_TABLET | Freq: Every day | ORAL | 0 refills | Status: DC

## 2018-01-12 MED ORDER — METOPROLOL SUCCINATE ER 25 MG PO TB24: 25.0000 mg | ORAL_TABLET | Freq: Every day | ORAL | 0 refills | Status: DC

## 2018-01-12 NOTE — Telephone Encounter (Signed)
Received fax from [facility] requesting a two week supply of metoprolol and rosuvastatin.

## 2018-01-20 ENCOUNTER — Encounter: Payer: Self-pay | Admitting: Cardiovascular Disease

## 2018-01-23 ENCOUNTER — Emergency Department (HOSPITAL_COMMUNITY)
Admission: EM | Admit: 2018-01-23 | Discharge: 2018-01-23 | Disposition: A | Discharge status: Home / Self Care | Payer: Managed Care, Other (non HMO) | Attending: Emergency Medicine | Admitting: Emergency Medicine

## 2018-01-23 ENCOUNTER — Encounter (HOSPITAL_COMMUNITY): Payer: Self-pay | Admitting: Emergency Medicine

## 2018-01-23 ENCOUNTER — Other Ambulatory Visit: Payer: Self-pay

## 2018-01-23 DIAGNOSIS — I10 Essential (primary) hypertension: Secondary | ICD-10-CM | POA: Insufficient documentation

## 2018-01-23 DIAGNOSIS — I2510 Atherosclerotic heart disease of native coronary artery without angina pectoris: Secondary | ICD-10-CM | POA: Insufficient documentation

## 2018-01-23 DIAGNOSIS — Z79899 Other long term (current) drug therapy: Secondary | ICD-10-CM | POA: Insufficient documentation

## 2018-01-23 DIAGNOSIS — R55 Syncope and collapse: Secondary | ICD-10-CM | POA: Insufficient documentation

## 2018-01-23 DIAGNOSIS — Z87891 Personal history of nicotine dependence: Secondary | ICD-10-CM | POA: Diagnosis not present

## 2018-01-23 DIAGNOSIS — Z7982 Long term (current) use of aspirin: Secondary | ICD-10-CM | POA: Insufficient documentation

## 2018-01-23 LAB — URINALYSIS, ROUTINE W REFLEX MICROSCOPIC
Bilirubin Urine: NEGATIVE
Glucose, UA: NEGATIVE mg/dL
HGB URINE DIPSTICK: NEGATIVE
KETONES UR: NEGATIVE mg/dL
Leukocytes, UA: NEGATIVE
Nitrite: NEGATIVE
PROTEIN: NEGATIVE mg/dL
Specific Gravity, Urine: 1.008 (ref 1.005–1.030)
pH: 6 (ref 5.0–8.0)

## 2018-01-23 LAB — BASIC METABOLIC PANEL
Anion gap: 7 (ref 5–15)
BUN: 11 mg/dL (ref 6–20)
CALCIUM: 9 mg/dL (ref 8.9–10.3)
CO2: 25 mmol/L (ref 22–32)
CREATININE: 1 mg/dL (ref 0.61–1.24)
Chloride: 107 mmol/L (ref 101–111)
GFR calc non Af Amer: >60 mL/min (ref >60)
Glucose, Bld: 94 mg/dL (ref 65–99)
Potassium: 3.8 mmol/L (ref 3.5–5.1)
Sodium: 139 mmol/L (ref 135–145)

## 2018-01-23 LAB — CBC
HCT: 41.6 % (ref 39.0–52.0)
Hemoglobin: 14 g/dL (ref 13.0–17.0)
MCH: 30.8 pg (ref 26.0–34.0)
MCHC: 33.7 g/dL (ref 30.0–36.0)
MCV: 91.4 fL (ref 78.0–100.0)
PLATELETS: 175 10*3/uL (ref 150–400)
RBC: 4.55 MIL/uL (ref 4.22–5.81)
RDW: 13.4 % (ref 11.5–15.5)
WBC: 7.2 10*3/uL (ref 4.0–10.5)

## 2018-01-23 LAB — CBG MONITORING, ED: GLUCOSE-CAPILLARY: 86 mg/dL (ref 65–99)

## 2018-01-23 LAB — I-STAT TROPONIN, ED: Troponin i, poc: 0 ng/mL (ref 0.00–0.08)

## 2018-01-23 NOTE — ED Provider Notes (Signed)
[facility] EMERGENCY DEPARTMENT Provider Note   CSN: [id number] Arrival date & time: 01/23/18  1845     History   Chief Complaint Chief Complaint  Patient presents with  . Near Syncope    HPI [name] is a 57 y.o. male.  The history is provided by the patient.  Near Syncope  This is a new problem. The current episode started 1 to 2 hours ago. The problem has been resolved. Pertinent negatives include no chest pain, no abdominal pain, no headaches and no shortness of breath. Nothing aggravates the symptoms. He has tried nothing for the symptoms. The treatment provided no relief.    Past Medical History:  Diagnosis Date  . Arthritis   . Coronary artery disease   . History of heart attack    seen by Dr Lia Foyer  . Hyperlipidemia   . Hypertension   . Myocardial infarction [facility]) 2005-2006?   stent 07    Patient Active Problem List   Diagnosis Date Noted  . Pigmented mole 02/03/2012  . CIGARETTE SMOKER 02/06/2009  . HYPERCHOLESTEROLEMIA 01/19/2009  . Coronary Artery Disease, s/p Inferior MI tx with stent to RCA 01/19/2009  . PALPITATIONS, HX OF 01/19/2009    Past Surgical History:  Procedure Laterality Date  . bicep surgery      right bicep   . CARDIAC CATHETERIZATION  2008,07  . ELBOW SURGERY     left elbow   . LEFT HEART CATHETERIZATION WITH CORONARY ANGIOGRAM N/A 03/29/2014   Procedure: LEFT HEART CATHETERIZATION WITH CORONARY ANGIOGRAM;  Surgeon: Blane Ohara, MD;  Location: [facility] C[facility] LAB;  Service: Cardiovascular;  Laterality: N/A;  . MASS EXCISION  09/11/2011   Procedure: EXCISION MASS;  Surgeon: Gayland Curry, MD;  Location: [facility];  Service: General;  Laterality: Left;  excision of left groin subcutaenous mass  . NECK SURGERY  2009  . SHOULDER ARTHROSCOPY    . WRIST ARTHROSCOPY WITH DEBRIDEMENT  09/27/2012   Procedure: WRIST ARTHROSCOPY WITH DEBRIDEMENT;  Surgeon: Wynonia Sours, MD;  Location: [facility];  Service: Orthopedics;  Laterality: Left;  LEFT WRIST ARTHROSCOPY DEBRIDEMENT  SHRINKAGE TRIANGULAR FIBROCARTILAGE COMPLEX/ LIGAMENT         Home Medications    Prior to Admission medications   Medication Sig Start Date End Date Taking? Authorizing Provider  albuterol (PROAIR HFA) 108 (90 Base) MCG/ACT inhaler Inhale 1 puff into the lungs every 6 (six) hours as needed for shortness of breath. Reported on 04/16/2016 06/24/17   Sherren Mocha, MD  aspirin 81 MG tablet Take 81 mg by mouth daily.     [provider]  diclofenac (VOLTAREN) 75 MG EC tablet Take 75 mg by mouth 2 (two) times daily. 08/14/16   [provider]  metoprolol succinate (TOPROL-XL) 25 MG 24 hr tablet Take 1 tablet (25 mg total) by mouth daily. Please keep upcoming appt for future refills. Thank you 01/12/18   Sherren Mocha, MD  nitroGLYCERIN (NITROSTAT) 0.4 MG SL tablet Place 1 tablet (0.4 mg total) under the tongue every 5 (five) minutes as needed. DISSOLVE 1 TAB UNDER TONGUE EVERY 5 MINUTES AS NEEDED FOR CHEST PAIN MAY REPEAT 3 TIMES 10/08/16   Sherren Mocha, MD  Omega-3 Fatty Acids (FISH OIL) 1000 MG CAPS Take 3 capsules by mouth daily.     [provider]  rosuvastatin (CRESTOR) 10 MG tablet Take 1 tablet (10 mg total) by mouth daily. Please keep upcoming appt for future  refills. Thank you 01/12/18   Sherren Mocha, MD    Family History Family History  Problem Relation Age of Onset  . Heart attack Mother   . Heart disease Mother   . Aneurysm Father   . Cancer Maternal Uncle        lung    Social History Social History   Tobacco Use  . Smoking status: Former Smoker    Packs/day: 1.00    Last attempt to quit: 10/31/2015    Years since quitting: 2.2  . Smokeless tobacco: Never Used  Substance Use Topics  . Alcohol use: No  . Drug use: No     Allergies   Oxycodone-acetaminophen and Percocet [oxycodone-acetaminophen]   Review of Systems Review of Systems    Constitutional: Negative for chills and fever.  HENT: Negative for ear pain and sore throat.   Eyes: Negative for pain and visual disturbance.  Respiratory: Negative for cough and shortness of breath.   Cardiovascular: Positive for near-syncope. Negative for chest pain and palpitations.  Gastrointestinal: Negative for abdominal pain and vomiting.  Genitourinary: Negative for dysuria and hematuria.  Musculoskeletal: Negative for arthralgias and back pain.  Skin: Negative for color change and rash.  Neurological: Positive for light-headedness. Negative for seizures, syncope and headaches.  All other systems reviewed and are negative.    Physical Exam Updated Vital Signs  ED Triage Vitals  Enc Vitals Group     BP 01/23/18 1854 126/78     Pulse Rate 01/23/18 1854 66     Resp 01/23/18 1854 (!) 22     Temp --      Temp src --      SpO2 01/23/18 1854 92 %     Weight --      Height --      Head Circumference --      Peak Flow --      Pain Score 01/23/18 1847 0     Pain Loc --      Pain Edu? --      Excl. in [place]? --     Physical Exam  Constitutional: He is oriented to person, place, and time. He appears well-developed and well-nourished.  HENT:  Head: Normocephalic and atraumatic.  Eyes: Pupils are equal, round, and reactive to light. Conjunctivae and EOM are normal.  Neck: Normal range of motion. Neck supple.  Cardiovascular: Normal rate, regular rhythm, normal heart sounds and intact distal pulses.  No murmur heard. Pulmonary/Chest: Effort normal and breath sounds normal. No respiratory distress.  Abdominal: Soft. There is no tenderness.  Musculoskeletal: Normal range of motion. He exhibits no edema.  Neurological: He is alert and oriented to person, place, and time. No cranial nerve deficit or sensory deficit. He exhibits normal muscle tone. Coordination normal.  5+/5 strength, normal sensation, no drift, normal finger to nose finger  Skin: Skin is warm and dry.   Psychiatric: He has a normal mood and affect.  Nursing note and vitals reviewed.    ED Treatments / Results  Labs (all labs ordered are listed, but only abnormal results are displayed) Labs Reviewed  URINALYSIS, ROUTINE W REFLEX MICROSCOPIC - Abnormal; Notable for the following components:      Result Value   Color, Urine STRAW (*)    All other components within normal limits  BASIC METABOLIC PANEL  CBC  CBG MONITORING, ED  I-STAT TROPONIN, ED    EKG EKG Interpretation  Date/Time:  Saturday January 23 2018 18:53:54 EDT Ventricular Rate:  61 PR Interval:    QRS Duration: 90 QT Interval:  395 QTC Calculation: 398 R Axis:   78 Text Interpretation:  Sinus rhythm Probable anteroseptal infarct, old Baseline wander Otherwise within normal limits Confirmed by Carmin Muskrat [phone]) on 01/23/2018 8:17:47 PM   Radiology No results found.  Procedures Procedures (including critical care time)  Medications Ordered in ED Medications - No data to display   Initial Impression / Assessment and Plan / ED Course  I have reviewed the triage vital signs and the nursing notes.  Pertinent labs & imaging results that were available during my care of the patient were reviewed by me and considered in my medical decision making (see chart for details).     [name] is a 57 year old male with history of hypertension, high cholesterol, coronary artery disease who presents to the ED after near syncope.  Patient with normal vitals.  No fever.  Patient with near syncopal event today while shopping for food.  Patient states that he spent all day outside working on his house, mowing the lawn, cutting trees and had not had anything to eat or drink all day.  He went to the store to get food when all of a sudden he felt warm, flushed, tunnel vision and was able to sit down and feel better.  He did not pass out or lose consciousness.  Has had no chest pain, shortness of breath, abdominal pain.  No  weakness.  Patient currently at his baseline.  Normal neurological exam.  No murmur.  Overall unremarkable exam.  No signs to suggest stroke.  EKG performed shows normal sinus rhythm.  No signs of ischemic changes.  Unchanged from prior.  Troponin within normal limits and patient without chest pain and doubt ACS.  Patient with no significant electrolyte abnormality, acute kidney injury, anemia to correlate with event.  Patient with a history and physical that is most consistent with a vasovagal syncope.  No concern for arrhythmia given history as well.  Recommend increase hydration and food.  Patient was observed without any issues and was discharged from the ED in good condition.  Recommend close follow-up with primary care provider.  He does have follow-up with cardiology already in place next week.    Final Clinical Impressions(s) / ED Diagnoses   Final diagnoses:  Near syncope    ED Discharge Orders    None       Lennice Sites, DO 01/23/18 2051    Carmin Muskrat, MD 01/25/18 0002

## 2018-01-23 NOTE — ED Triage Notes (Signed)
Pt arrived [facility] from [facility] where he felt weak, nauseated, and lightheaded. No LOC, CP, SOB reported and lasted for a couple minutes. EKG unremarkable per EMS. 18LAC 324ASA administered by a bystander. BP124/83 P70 98%RA RR16 CBG92

## 2018-01-23 NOTE — ED Notes (Signed)
IV removed, discharge vitals taken and pt getting dressed at this time.

## 2018-02-03 ENCOUNTER — Ambulatory Visit (INDEPENDENT_AMBULATORY_CARE_PROVIDER_SITE_OTHER): Payer: Managed Care, Other (non HMO) | Admitting: Cardiovascular Disease

## 2018-02-03 ENCOUNTER — Encounter: Payer: Self-pay | Admitting: Cardiovascular Disease

## 2018-02-03 ENCOUNTER — Telehealth: Payer: Self-pay

## 2018-02-03 VITALS — BP 138/82 | HR 69 | Ht 67.0 in | Wt 160.0 lb

## 2018-02-03 DIAGNOSIS — I2510 Atherosclerotic heart disease of native coronary artery without angina pectoris: Secondary | ICD-10-CM

## 2018-02-03 DIAGNOSIS — E7800 Pure hypercholesterolemia, unspecified: Secondary | ICD-10-CM | POA: Diagnosis not present

## 2018-02-03 DIAGNOSIS — R609 Edema, unspecified: Secondary | ICD-10-CM

## 2018-02-03 DIAGNOSIS — R55 Syncope and collapse: Secondary | ICD-10-CM

## 2018-02-03 LAB — LIPID PANEL
CHOL/HDL RATIO: 3.5 ratio (ref 0.0–5.0)
Cholesterol, Total: 147 mg/dL (ref 100–199)
HDL: 42 mg/dL (ref >39)
LDL Calculated: 86 mg/dL (ref 0–99)
TRIGLYCERIDES: 93 mg/dL (ref 0–149)
VLDL CHOLESTEROL CAL: 19 mg/dL (ref 5–40)

## 2018-02-03 NOTE — Telephone Encounter (Signed)
Patient called to report he drank a cup of coffee with 3 tsp of sugar prior to fasting labs. Informed him a redraw will be done if labs are above goal. He was grateful for assistance.

## 2018-02-03 NOTE — Progress Notes (Signed)
Cardiology Office Note Date:  02/03/2018   ID:  [name], DOB 02-10-1961, MRN [id number]  PCP:  Gaynelle Arabian, MD  Cardiologist:  Sherren Mocha, MD    Chief Complaint  Patient presents with  . Follow-up    CAD  . Leg Swelling     History of Present Illness: [name] is a 57 y.o. male who presents for follow-up of coronary artery disease. The patient initially presented with an inferior wall MI treated with stenting of the RCA. He developed recurrence of chest pain one year ago and nuclear scan demonstrated an inferior perfusion defect with partial reversibility, LVEF 52%. Cardiac catheterization demonstrated patency of his stent site with only mild in-stent restenosis and no significant coronary obstruction elsewhere. Medical therapy was recommended.  The patient is here alone today.  He has been feeling fairly well.  He is back to smoking cigarettes again.  He is noticed some leg swelling with a sock indentation and he is been concerned that he is developed congestive heart failure.  He denies shortness of breath, orthopnea, or PND.  Has had no recent chest pain.  He is compliant with his medications.  Last month he had an episode of near syncope after working out in his yard all day.  He was evaluated in the emergency department and his labs and EKG demonstrated no significant abnormalities.  He has had no further problems.   Past Medical History:  Diagnosis Date  . Arthritis   . Coronary artery disease   . History of heart attack    seen by Dr Lia Foyer  . Hyperlipidemia   . Hypertension   . Myocardial infarction [facility]) 2005-2006?   stent 07    Past Surgical History:  Procedure Laterality Date  . bicep surgery      right bicep   . CARDIAC CATHETERIZATION  2008,07  . ELBOW SURGERY     left elbow   . LEFT HEART CATHETERIZATION WITH CORONARY ANGIOGRAM N/A 03/29/2014   Procedure: LEFT HEART CATHETERIZATION WITH CORONARY ANGIOGRAM;  Surgeon: Blane Ohara, MD;   Location: [facility] CATH LAB;  Service: Cardiovascular;  Laterality: N/A;  . MASS EXCISION  09/11/2011   Procedure: EXCISION MASS;  Surgeon: Gayland Curry, MD;  Location: [facility];  Service: General;  Laterality: Left;  excision of left groin subcutaenous mass  . NECK SURGERY  2009  . SHOULDER ARTHROSCOPY    . WRIST ARTHROSCOPY WITH DEBRIDEMENT  09/27/2012   Procedure: WRIST ARTHROSCOPY WITH DEBRIDEMENT;  Surgeon: Wynonia Sours, MD;  Location: [place];  Service: Orthopedics;  Laterality: Left;  LEFT WRIST ARTHROSCOPY DEBRIDEMENT  SHRINKAGE TRIANGULAR FIBROCARTILAGE COMPLEX/ LIGAMENT     Current Outpatient Medications  Medication Sig Dispense Refill  . albuterol (PROAIR HFA) 108 (90 Base) MCG/ACT inhaler Inhale 1 puff into the lungs every 6 (six) hours as needed for shortness of breath. Reported on 04/16/2016 1 Inhaler 0  . aspirin 81 MG tablet Take 81 mg by mouth daily.     . diclofenac (VOLTAREN) 75 MG EC tablet Take 75 mg by mouth 2 (two) times daily.    . metoprolol succinate (TOPROL-XL) 25 MG 24 hr tablet Take 1 tablet (25 mg total) by mouth daily. Please keep upcoming appt for future refills. Thank you 14 tablet 0  . nitroGLYCERIN (NITROSTAT) 0.4 MG SL tablet Place 1 tablet (0.4 mg total) under the tongue every 5 (five) minutes as needed. DISSOLVE 1 TAB UNDER TONGUE EVERY 5  MINUTES AS NEEDED FOR CHEST PAIN MAY REPEAT 3 TIMES 25 tablet 2  . Omega-3 Fatty Acids (FISH OIL) 1000 MG CAPS Take 3 capsules by mouth daily.     . rosuvastatin (CRESTOR) 10 MG tablet Take 1 tablet (10 mg total) by mouth daily. Please keep upcoming appt for future refills. Thank you 14 tablet 0   No current facility-administered medications for this visit.     Allergies:   Oxycodone-acetaminophen and Percocet [oxycodone-acetaminophen]   Social History:  The patient  reports that he quit smoking about 2 years ago. He smoked 1.00 pack per day. He has never used smokeless tobacco. He reports  that he does not drink alcohol or use drugs.   Family History:  The patient's  family history includes Aneurysm in his father; Cancer in his maternal uncle; Heart attack in his mother; Heart disease in his mother.    ROS:  Please see the history of present illness.  Otherwise, review of systems is positive for back pain, headaches.  All other systems are reviewed and negative.    PHYSICAL EXAM: VS:  BP 138/82   Pulse 69   Ht 5\' 7"  (1.702 m)   Wt 160 lb (72.6 kg)   SpO2 95%   BMI 25.06 kg/m  , BMI Body mass index is 25.06 kg/m. GEN: Well nourished, well developed, in no acute distress  HEENT: normal  Neck: no JVD, no masses. No carotid bruits Cardiac: RRR without murmur or gallop                Respiratory:  clear to auscultation bilaterally, normal work of breathing GI: soft, nontender, nondistended, + BS MS: no deformity or atrophy  Ext: trace bilateral pretibial edema, pedal pulses 2+= bilaterally Skin: warm and dry, no rash Neuro:  Strength and sensation are intact Psych: euthymic mood, full affect  EKG:  EKG is not ordered today.  Recent Labs: 01/23/2018: BUN 11; Creatinine, Ser 1.00; Hemoglobin 14.0; Platelets 175; Potassium 3.8; Sodium 139   Lipid Panel     Component Value Date/Time   CHOL 147 02/03/2018 1022   TRIG 93 02/03/2018 1022   HDL 42 02/03/2018 1022   CHOLHDL 3.5 02/03/2018 1022   CHOLHDL 3.1 07/23/2015 0734   VLDL 11 07/23/2015 0734   LDLCALC 86 02/03/2018 1022   LDLDIRECT 141.1 09/10/2009 1037      Wt Readings from Last 3 Encounters:  02/03/18 160 lb (72.6 kg)  10/08/16 161 lb (73 kg)  04/16/16 160 lb (72.6 kg)     ASSESSMENT AND PLAN: 1.  CAD, native vessel, without angina: The patient is stable on his current medical program which includes aspirin for antiplatelet therapy, a beta-blocker, and a statin drug.  2. Tobacco abuse: Cessation counseling is done.  3. Hyperlipidemia: treated with rosuvastatin 10 mg.  We will draw lipids and LFTs.   Lifestyle modification is discussed.  4. Syncope: Recent emergency room evaluation reviewed.  Suspect this was related to mild dehydration.  We will check an echo to evaluate for potential etiologies of syncope.  In addition the patient is concerned about leg swelling in his cardiac function as it relates to this.  Current medicines are reviewed with the patient today.  The patient does not have concerns regarding medicines.  Labs/ tests ordered today include:   Orders Placed This Encounter  Procedures  . Lipid panel  . ECHOCARDIOGRAM COMPLETE    Disposition:   FU one year  Signed, Sherren Mocha, MD  02/03/2018 6:19  PM    [facility] [place], [place], [place]  [address] Phone: [phone]; Fax: [phone]

## 2018-02-03 NOTE — Patient Instructions (Signed)
Medication Instructions:  Your provider recommends that you continue on your current medications as directed. Please refer to the Current Medication list given to you today.    Labwork: TODAY: lipids  Testing/Procedures: Your provider has requested that you have an echocardiogram. Echocardiography is a painless test that uses sound waves to create images of your heart. It provides your doctor with information about the size and shape of your heart and how well your heart's chambers and valves are working. This procedure takes approximately one hour. There are no restrictions for this procedure.  Follow-Up: Your provider wants you to follow-up in: 1 year with Dr. Burt Knack. You will receive a reminder letter in the mail two months in advance. If you don't receive a letter, please call our office to schedule the follow-up appointment.    Any Other Special Instructions Will Be Listed Below (If Applicable).     If you need a refill on your cardiac medications before your next appointment, please call your pharmacy.

## 2018-02-08 ENCOUNTER — Telehealth: Payer: Self-pay | Admitting: Cardiovascular Disease

## 2018-02-08 NOTE — Telephone Encounter (Signed)
Patient states he needs to reschedule echo because he has a funeral to attend. Rescheduled echo for Thursday. Patient was grateful for assistance.

## 2018-02-08 NOTE — Telephone Encounter (Signed)
New Message   Pt is calling with questions about his echo

## 2018-02-09 ENCOUNTER — Other Ambulatory Visit (HOSPITAL_COMMUNITY): Payer: Managed Care, Other (non HMO)

## 2018-02-11 ENCOUNTER — Ambulatory Visit (HOSPITAL_COMMUNITY): Payer: Managed Care, Other (non HMO) | Attending: Cardiovascular Disease

## 2018-02-11 ENCOUNTER — Other Ambulatory Visit: Payer: Self-pay

## 2018-02-11 DIAGNOSIS — I252 Old myocardial infarction: Secondary | ICD-10-CM | POA: Insufficient documentation

## 2018-02-11 DIAGNOSIS — E785 Hyperlipidemia, unspecified: Secondary | ICD-10-CM | POA: Diagnosis not present

## 2018-02-11 DIAGNOSIS — R609 Edema, unspecified: Secondary | ICD-10-CM | POA: Insufficient documentation

## 2018-02-11 DIAGNOSIS — R55 Syncope and collapse: Secondary | ICD-10-CM

## 2018-02-11 DIAGNOSIS — I313 Pericardial effusion (noninflammatory): Secondary | ICD-10-CM | POA: Diagnosis not present

## 2018-02-11 DIAGNOSIS — I119 Hypertensive heart disease without heart failure: Secondary | ICD-10-CM | POA: Insufficient documentation

## 2018-02-11 DIAGNOSIS — I2510 Atherosclerotic heart disease of native coronary artery without angina pectoris: Secondary | ICD-10-CM | POA: Insufficient documentation

## 2018-02-15 ENCOUNTER — Telehealth: Payer: Self-pay | Admitting: Cardiovascular Disease

## 2018-02-15 DIAGNOSIS — E785 Hyperlipidemia, unspecified: Secondary | ICD-10-CM

## 2018-02-15 MED ORDER — ROSUVASTATIN CALCIUM 20 MG PO TABS: 20.0000 mg | ORAL_TABLET | Freq: Every day | ORAL | 3 refills | Status: DC

## 2018-02-15 NOTE — Telephone Encounter (Signed)
New Message     *STAT* If patient is at the pharmacy, call can be transferred to refill team.   1. Which medications need to be refilled? (please list name of each medication and dose if known) Crestor  2. Which pharmacy/location (including street and city if local pharmacy) is medication to be sent to? [facility]   3. Do they need a 30 day or 90 day supply? 24 DAY   Patient had rx sent to [facility] but it needs to go to [facility]

## 2018-02-15 NOTE — Telephone Encounter (Signed)
New Message   Patient is returning call in reference to lab results. Please call to discuss.  

## 2018-02-15 NOTE — Telephone Encounter (Signed)
-----   Message from Sherren Mocha, MD sent at 02/09/2018  6:02 PM EDT ----- Recommend increase rosuvastatin to 20 mg daily and repeat labs in 3-4 months. thx

## 2018-02-15 NOTE — Telephone Encounter (Signed)
Informed patient of results and verbal understanding expressed.  Instructed patient to INCREASE CRESTOR to 20 mg daily. FLP and LFTs scheduled 8/26. Patient agrees with treatment plan.

## 2018-02-15 NOTE — Telephone Encounter (Signed)
-----   Message from Sherren Mocha, MD sent at 02/14/2018  2:06 PM EDT ----- LV function is in the low normal range, stable from past assessment. No significant valvular disease. Diastolic dysfunction noted. Need to control BP, maintain exercise program, and eat low sodium diet. Otherwise continue current Rx.

## 2018-02-15 NOTE — Addendum Note (Signed)
Addended by: Juventino Slovak on: 02/15/2018 04:44 PM   Modules accepted: Orders

## 2018-03-24 ENCOUNTER — Other Ambulatory Visit: Payer: Self-pay | Admitting: Cardiovascular Disease

## 2018-05-08 ENCOUNTER — Other Ambulatory Visit: Payer: Self-pay | Admitting: Cardiovascular Disease

## 2018-05-24 ENCOUNTER — Other Ambulatory Visit: Payer: Managed Care, Other (non HMO) | Admitting: *Deleted

## 2018-05-24 DIAGNOSIS — E785 Hyperlipidemia, unspecified: Secondary | ICD-10-CM

## 2018-05-24 LAB — HEPATIC FUNCTION PANEL
ALT: 33 IU/L (ref 0–44)
AST: 27 IU/L (ref 0–40)
Albumin: 4.6 g/dL (ref 3.5–5.5)
Alkaline Phosphatase: 71 IU/L (ref 39–117)
Bilirubin Total: 0.5 mg/dL (ref 0.0–1.2)
Bilirubin, Direct: 0.14 mg/dL (ref 0.00–0.40)
Total Protein: 6.6 g/dL (ref 6.0–8.5)

## 2018-05-24 LAB — LIPID PANEL
Chol/HDL Ratio: 2.9 ratio (ref 0.0–5.0)
Cholesterol, Total: 117 mg/dL (ref 100–199)
HDL: 40 mg/dL (ref >39)
LDL Calculated: 62 mg/dL (ref 0–99)
TRIGLYCERIDES: 76 mg/dL (ref 0–149)
VLDL CHOLESTEROL CAL: 15 mg/dL (ref 5–40)

## 2019-01-28 ENCOUNTER — Telehealth: Payer: Self-pay | Admitting: Physician Assistant

## 2019-01-28 NOTE — Telephone Encounter (Signed)
° ° °  VIDEO/[email or web address].me visit on 02/04/2019     Consent sent via MyChart   -  01/28/2019

## 2019-02-03 NOTE — Progress Notes (Signed)
Virtual Visit via Video Note   This visit type was conducted due to national recommendations for restrictions regarding the COVID-19 Pandemic (e.g. social distancing) in an effort to limit this patient's exposure and mitigate transmission in our community.  Due to his co-morbid illnesses, this patient is at least at moderate risk for complications without adequate follow up.  This format is felt to be most appropriate for this patient at this time.  All issues noted in this document were discussed and addressed.  A limited physical exam was performed with this format.  Please refer to the patient's chart for his consent to telehealth for [facility].   Date:  02/04/2019   ID:  [name], DOB 11/04/1960, MRN [id number]  Patient Location: Home Provider Location: Home  PCP:  Gaynelle Arabian, MD  Cardiologist:  Sherren Mocha, MD   Electrophysiologist:  None   Evaluation Performed:  Follow-Up Visit  Chief Complaint:  Follow-up on coronary artery disease  History of Present Illness:    [name] is a 58 y.o. male with coronary artery disease s/p prior inferior MI treated with stenting to the RCA, hyperlipidemia.  Cardiac Catheterization in 2015 demonstrated a patent RCA stent.  He was last seen in 01/2018.  Today, he notes he is doing well.  He quit smoking 7 months ago.  He feels much better and is no longer coughing.  He is also breathing better.  He has not had any chest discomfort, syncope, orthopnea, lower extremity swelling.  He works at [employer] on [address].  They have been practicing social distancing on his job.  The patient does not have symptoms concerning for COVID-19 infection (fever, chills, cough, or new shortness of breath).    Past Medical History:  Diagnosis Date  . Arthritis   . Coronary artery disease   . History of heart attack    seen by Dr Lia Foyer  . Hyperlipidemia   . Hypertension   . Myocardial infarction [facility]) 2005-2006?   stent 07    Past Surgical History:  Procedure Laterality Date  . bicep surgery      right bicep   . CARDIAC CATHETERIZATION  2008,07  . ELBOW SURGERY     left elbow   . LEFT HEART CATHETERIZATION WITH CORONARY ANGIOGRAM N/A 03/29/2014   Procedure: LEFT HEART CATHETERIZATION WITH CORONARY ANGIOGRAM;  Surgeon: Blane Ohara, MD;  Location: [facility] CATH LAB;  Service: Cardiovascular;  Laterality: N/A;  . MASS EXCISION  09/11/2011   Procedure: EXCISION MASS;  Surgeon: Gayland Curry, MD;  Location: [place];  Service: General;  Laterality: Left;  excision of left groin subcutaenous mass  . NECK SURGERY  2009  . SHOULDER ARTHROSCOPY    . WRIST ARTHROSCOPY WITH DEBRIDEMENT  09/27/2012   Procedure: WRIST ARTHROSCOPY WITH DEBRIDEMENT;  Surgeon: Wynonia Sours, MD;  Location: [place];  Service: Orthopedics;  Laterality: Left;  LEFT WRIST ARTHROSCOPY DEBRIDEMENT  SHRINKAGE TRIANGULAR FIBROCARTILAGE COMPLEX/ LIGAMENT      Current Meds  Medication Sig  . aspirin 81 MG tablet Take 81 mg by mouth daily.   . metoprolol succinate (TOPROL-XL) 25 MG 24 hr tablet Take 1 tablet (25 mg total) by mouth daily.  . nitroGLYCERIN (NITROSTAT) 0.4 MG SL tablet Place 1 tablet (0.4 mg total) under the tongue every 5 (five) minutes as needed. DISSOLVE 1 TAB UNDER TONGUE EVERY 5 MINUTES AS NEEDED FOR CHEST PAIN MAY REPEAT 3 TIMES  . Omega-3 Fatty  Acids (FISH OIL) 1000 MG CAPS Take 3 capsules by mouth daily.   . rosuvastatin (CRESTOR) 20 MG tablet Take 1 tablet (20 mg total) by mouth daily.  . [DISCONTINUED] diclofenac (VOLTAREN) 75 MG EC tablet Take 75 mg by mouth 2 (two) times daily.     Allergies:   Percocet [oxycodone-acetaminophen] and Oxycodone-acetaminophen   Social History   Tobacco Use  . Smoking status: Former Smoker    Packs/day: 1.00    Last attempt to quit: 10/31/2015    Years since quitting: 3.2  . Smokeless tobacco: Never Used  Substance Use Topics  . Alcohol use: No  . Drug use:  No     Family Hx: The patient's family history includes Aneurysm in his father; Cancer in his maternal uncle; Heart attack in his mother; Heart disease in his mother.  ROS:   Please see the history of present illness.    All other systems reviewed and are negative.   Prior CV studies:   The following studies were reviewed today:  Echo 02/11/18 EF 50-55, no RWMA, Gr 2 DD, trivial eff  Nuclear (03/14/14):   Intermediate risk, EF 52%, apical inferior and mid inferior perfusion defect with partial reversibility.  LHC (03/29/14):  OM1 20-30%, proximal RCA stent 30-40% ISR, EF 55% with inferior hypokinesis (mild). >>> Med Rx  Echo (04/2006):   EF 55-60%  Labs/Other Tests and Data Reviewed:    EKG:  No ECG reviewed.  Recent Labs: 05/24/2018: ALT 33   Recent Lipid Panel Lab Results  Component Value Date/Time   CHOL 117 05/24/2018 07:26 AM   TRIG 76 05/24/2018 07:26 AM   HDL 40 05/24/2018 07:26 AM   CHOLHDL 2.9 05/24/2018 07:26 AM   CHOLHDL 3.1 07/23/2015 07:34 AM   LDLCALC 62 05/24/2018 07:26 AM   LDLDIRECT 141.1 09/10/2009 10:37 AM     Wt Readings from Last 3 Encounters:  02/04/19 163 lb 6.4 oz (74.1 kg)  02/03/18 160 lb (72.6 kg)  10/08/16 161 lb (73 kg)     Objective:    Vital Signs:  BP 111/78   Pulse 67   Ht 5\' 7"  (1.702 m)   Wt 163 lb 6.4 oz (74.1 kg)   BMI 25.59 kg/m    VITAL SIGNS:  reviewed GEN:  no acute distress EYES:  sclerae anicteric, EOMI - Extraocular Movements Intact RESPIRATORY:  Normal respiratory effort NEURO:  alert and oriented x 3, no obvious focal deficit PSYCH:  normal affect  ASSESSMENT & PLAN:    Coronary artery disease without angina pectoris History of remote inferior MI treated with PCI to the RCA.  Echocardiogram in May 2019 demonstrated normal LV function with moderate diastolic dysfunction.  He is doing well without anginal symptoms.  Continue aspirin, statin.  Hyperlipidemia LDL goal <70 Most recent LDL optimal.  We  discussed pros and cons of continuing fish oil.  He would like to stop this medication.  I think that this is reasonable.  We will arrange a follow-up CMET, lipids in several months.  Educated About Covid-19 Virus Infection The signs and symptoms of COVID-19 were discussed with the patient and how to seek care for testing (follow up with PCP or arrange E-visit).  The importance of social distancing was discussed today.  Time:   Today, I have spent 8.5 minutes with the patient with telehealth technology discussing the above problems.     Medication Adjustments/Labs and Tests Ordered: Current medicines are reviewed at length with the patient today.  Concerns  regarding medicines are outlined above.   Tests Ordered: Orders Placed This Encounter  Procedures  . Lipid panel  . Comprehensive metabolic panel    Medication Changes: No orders of the defined types were placed in this encounter.   Disposition:  Follow up in 1 year(s)  Signed, Richardson Dopp, PA-C  02/04/2019 2:13 PM    [facility]

## 2019-02-04 ENCOUNTER — Encounter: Payer: Self-pay | Admitting: Physician Assistant

## 2019-02-04 ENCOUNTER — Telehealth (INDEPENDENT_AMBULATORY_CARE_PROVIDER_SITE_OTHER): Payer: Managed Care, Other (non HMO) | Admitting: Physician Assistant

## 2019-02-04 ENCOUNTER — Other Ambulatory Visit: Payer: Self-pay

## 2019-02-04 VITALS — BP 111/78 | HR 67 | Ht 67.0 in | Wt 163.4 lb

## 2019-02-04 DIAGNOSIS — Z7189 Other specified counseling: Secondary | ICD-10-CM

## 2019-02-04 DIAGNOSIS — I2510 Atherosclerotic heart disease of native coronary artery without angina pectoris: Secondary | ICD-10-CM

## 2019-02-04 DIAGNOSIS — E785 Hyperlipidemia, unspecified: Secondary | ICD-10-CM

## 2019-02-04 NOTE — Patient Instructions (Signed)
Medication Instructions:  No changes.  If you want to stop taking Fish Oil, you can.  If you need a refill on your cardiac medications before your next appointment, please call your pharmacy.   Lab work: In September 2020 - CMET, Fasting Lipids  If you have labs (blood work) drawn today and your tests are completely normal, you will receive your results only by: Marland Kitchen MyChart Message (if you have MyChart) OR . A paper copy in the mail If you have any lab test that is abnormal or we need to change your treatment, we will call you to review the results.  Testing/Procedures: None   Follow-Up: At [facility], you and your health needs are our priority.  As part of our continuing mission to provide you with exceptional heart care, we have created designated Provider Care Teams.  These Care Teams include your primary Cardiologist (physician) and Advanced Practice Providers (APPs -  Physician Assistants and Nurse Practitioners) who all work together to provide you with the care you need, when you need it. You will need a follow up appointment in:  12 months.  Please call our office 2 months in advance to schedule this appointment.  You may see Sherren Mocha, MD or Richardson Dopp, PA-C   Any Other Special Instructions Will Be Listed Below (If Applicable).

## 2019-02-08 ENCOUNTER — Other Ambulatory Visit: Payer: Self-pay | Admitting: Cardiovascular Disease

## 2019-03-14 ENCOUNTER — Telehealth: Payer: Self-pay | Admitting: Physician Assistant

## 2019-03-14 NOTE — Telephone Encounter (Signed)
Please make sure the patient is scheduled for fasting CMET, Lipids (order in Epic already). Richardson Dopp, PA-C    03/14/2019 6:18 PM

## 2019-05-01 ENCOUNTER — Other Ambulatory Visit: Payer: Self-pay | Admitting: Cardiovascular Disease

## 2019-05-03 ENCOUNTER — Ambulatory Visit (INDEPENDENT_AMBULATORY_CARE_PROVIDER_SITE_OTHER): Payer: Managed Care, Other (non HMO) | Admitting: Orthopaedic Surgery

## 2019-05-03 ENCOUNTER — Ambulatory Visit (INDEPENDENT_AMBULATORY_CARE_PROVIDER_SITE_OTHER): Payer: Managed Care, Other (non HMO)

## 2019-05-03 ENCOUNTER — Other Ambulatory Visit: Payer: Self-pay

## 2019-05-03 ENCOUNTER — Encounter: Payer: Self-pay | Admitting: Orthopaedic Surgery

## 2019-05-03 VITALS — Ht 66.0 in | Wt 164.0 lb

## 2019-05-03 DIAGNOSIS — M7542 Impingement syndrome of left shoulder: Secondary | ICD-10-CM | POA: Diagnosis not present

## 2019-05-03 DIAGNOSIS — G8929 Other chronic pain: Secondary | ICD-10-CM

## 2019-05-03 DIAGNOSIS — M25512 Pain in left shoulder: Secondary | ICD-10-CM

## 2019-05-03 DIAGNOSIS — M25562 Pain in left knee: Secondary | ICD-10-CM | POA: Diagnosis not present

## 2019-05-03 DIAGNOSIS — M2242 Chondromalacia patellae, left knee: Secondary | ICD-10-CM | POA: Diagnosis not present

## 2019-05-03 MED ORDER — LIDOCAINE HCL 1 % IJ SOLN
0.5000 mL | INTRAMUSCULAR | Status: AC | PRN
  Administered 2019-05-03: .5 mL

## 2019-05-03 MED ORDER — BUPIVACAINE HCL 0.5 % IJ SOLN
3.0000 mL | INTRAMUSCULAR | Status: AC | PRN
  Administered 2019-05-03: 3 mL via INTRA_ARTICULAR

## 2019-05-03 MED ORDER — METHYLPREDNISOLONE ACETATE 40 MG/ML IJ SUSP
40.0000 mg | INTRAMUSCULAR | Status: AC | PRN
  Administered 2019-05-03: 40 mg via INTRA_ARTICULAR

## 2019-05-03 MED ORDER — BUPIVACAINE HCL 0.25 % IJ SOLN
4.0000 mL | INTRAMUSCULAR | Status: AC | PRN
  Administered 2019-05-03: 4 mL via INTRA_ARTICULAR

## 2019-05-03 NOTE — Progress Notes (Signed)
Office Visit Note   Patient: [name]           Date of Birth: 12/17/1960           MRN: [id number] Visit Date: 05/03/2019              Requested by: Gaynelle Arabian, MD [address] [place],  [place] [address] PCP: Gaynelle Arabian, MD   Assessment & Plan: Visit Diagnoses:  1. Acute pain of left knee   2. Chronic left shoulder pain   3. Chondromalacia patellae, left knee   4. Impingement syndrome of left shoulder     Plan: Patient had a fear of needles but tolerated injection of left shoulder subacromially as well as left knee.  He will let us know if he is having persistent symptoms after a month.  As has been discussed with him before by other providers to look at other job options if possible since he is doing heavy lifting job and has had multiple tendon repairs already.  Follow-Up Instructions: Return if symptoms worsen or fail to improve.   Orders:  Orders Placed This Encounter  Procedures   Large Joint Inj   Large Joint Inj: L knee   XR KNEE 3 VIEW LEFT   XR Shoulder Left   No orders of the defined types were placed in this encounter.     Procedures: Large Joint Inj on 05/03/2019 9:59 AM Indications: pain Details: 22 G 1.5 in needle  Arthrogram: No  Medications: 4 mL bupivacaine 0.25 %; 40 mg methylPREDNISolone acetate 40 MG/ML; 0.5 mL lidocaine 1 % Outcome: tolerated well, no immediate complications Procedure, treatment alternatives, risks and benefits explained, specific risks discussed. Consent was given by the patient. Immediately prior to procedure a time out was called to verify the correct patient, procedure, equipment, support staff and site/side marked as required. Patient was prepped and draped in the usual sterile fashion.   Large Joint Inj: L knee on 05/03/2019 9:59 AM Indications: joint swelling and pain Details: 22 G 1.5 in needle, anterolateral approach  Arthrogram: No  Medications: 3 mL bupivacaine 0.5 %; 0.5 mL lidocaine  1 %; 40 mg methylPREDNISolone acetate 40 MG/ML Outcome: tolerated well, no immediate complications Procedure, treatment alternatives, risks and benefits explained, specific risks discussed. Consent was given by the patient. Immediately prior to procedure a time out was called to verify the correct patient, procedure, equipment, support staff and site/side marked as required. Patient was prepped and draped in the usual sterile fashion.       Clinical Data: No additional findings.   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 Percell Miller years ago.  He is also had distal biceps tendon repair after rupture and also repair of extensor tendon left elbow.  He has been having problems with left knee pain and left shoulder pain.  He can get his arm up over his head but with flexion and abduction he has pain in certain positions and has to bypass that area.  Previous two-level cervical fusion he states his neck is doing okay.  This was done by Dr.Nudelman.  He has coronary disease ejection fractions greater than 50% and has had previous stent.  Is not able to take anti-inflammatories.  He is  having pain at work particular with turning or twisting the medial side of his left knee.  He denies fever chills no history of gout.  Review of Systems positive for CAD, cigarette smoker, surgeries on his left shoulder left elbow x2.  Previous cardiac stent.  Otherwise negative as pertains HPI.  14 point systems update.   Objective: Vital Signs: Ht 5\' 6"  (1.676 m)    Wt 164 lb (74.4 kg)    BMI 26.47 kg/m   Physical Exam Constitutional:      Appearance: He is well-developed.  HENT:     Head: Normocephalic and atraumatic.  Eyes:     Pupils: Pupils are equal, round, and reactive to light.  Neck:      Thyroid: No thyromegaly.     Trachea: No tracheal deviation.  Cardiovascular:     Rate and Rhythm: Normal rate.  Pulmonary:     Effort: Pulmonary effort is normal.     Breath sounds: No wheezing.  Abdominal:     General: Bowel sounds are normal.     Palpations: Abdomen is soft.  Skin:    General: Skin is warm and dry.     Capillary Refill: Capillary refill takes less than 2 seconds.  Neurological:     Mental Status: He is alert and oriented to person, place, and time.  Psychiatric:        Behavior: Behavior normal.        Thought Content: Thought content normal.        Judgment: Judgment normal.     Ortho Exam positive impingement left shoulder negative right negative Spurling no supraclavicular adenopathy.  Good flexion-extension of the surgical protocol spine.  Healed left antecubital incision from distal biceps tendon repair.  Full elbow extension.  Lateral epicondylar incision.  Negative logroll to the hips.  Mild crepitus slightly more on the right than left knee.  Pain with patellofemoral loading and quadriceps contracture.  Negative subluxation of the patella right and left.  No knee effusion is noted. Specialty Comments:  No specialty comments available.  Imaging: Xr Knee 3 View Left  Result Date: 05/03/2019 Standing AP both knees lateral left knee and bilateral sunrise patellar x-rays are reviewed.  This shows minimal patellofemoral symmetrical spurring.  Negative for acute changes. Impression: Mild osteoarthritis patellofemoral joint left knee.  Xr Shoulder Left  Result Date: 05/03/2019 Three-view x-rays left shoulder obtained and reviewed slight irregularity greater tuberosity which may be postsurgical.  Tiny glenohumeral osteophytes without high riding of the head previous acromioplasty noted. Impression: Postsurgical changes greater tuberosity.  Mild glenohumeral spurring.    PMFS History: Patient Active Problem List   Diagnosis Date Noted   Chondromalacia  patellae, left knee 05/03/2019   Impingement syndrome of left shoulder 05/03/2019   Pigmented mole 02/03/2012   CIGARETTE SMOKER 02/06/2009   HYPERCHOLESTEROLEMIA 01/19/2009   CAD (coronary artery disease) 01/19/2009   PALPITATIONS, HX OF 01/19/2009   Past Medical History:  Diagnosis Date   Arthritis    Coronary artery disease    History of heart attack    seen by Dr Lia Foyer   Hyperlipidemia    Hypertension    Myocardial infarction [facility]) 2005-2006?   stent 07    Family History  Problem Relation Age of Onset   Heart attack Mother    Heart disease Mother    Aneurysm Father    Cancer Maternal Uncle        lung    Past Surgical History:  Procedure Laterality Date  bicep surgery      right bicep    CARDIAC CATHETERIZATION  2008,07   ELBOW SURGERY     left elbow    LEFT HEART CATHETERIZATION WITH CORONARY ANGIOGRAM N/A 03/29/2014   Procedure: LEFT HEART CATHETERIZATION WITH CORONARY ANGIOGRAM;  Surgeon: Blane Ohara, MD;  Location: [facility] CATH LAB;  Service: Cardiovascular;  Laterality: N/A;   MASS EXCISION  09/11/2011   Procedure: EXCISION MASS;  Surgeon: Gayland Curry, MD;  Location: [place];  Service: General;  Laterality: Left;  excision of left groin subcutaenous mass   NECK SURGERY  2009   SHOULDER ARTHROSCOPY     WRIST ARTHROSCOPY WITH DEBRIDEMENT  09/27/2012   Procedure: WRIST ARTHROSCOPY WITH DEBRIDEMENT;  Surgeon: Wynonia Sours, MD;  Location: [place];  Service: Orthopedics;  Laterality: Left;  LEFT WRIST ARTHROSCOPY DEBRIDEMENT  SHRINKAGE TRIANGULAR FIBROCARTILAGE COMPLEX/ LIGAMENT    Social History   Occupational History   Not on file  Tobacco Use   Smoking status: Former Smoker    Packs/day: 1.00    Quit date: 10/31/2015    Years since quitting: 3.5   Smokeless tobacco: Never Used  Substance and Sexual Activity   Alcohol use: No   Drug use: No   Sexual activity: Not on file

## 2019-06-07 ENCOUNTER — Other Ambulatory Visit: Payer: Managed Care, Other (non HMO)

## 2019-06-14 ENCOUNTER — Ambulatory Visit: Payer: Managed Care, Other (non HMO) | Admitting: Orthopaedic Surgery

## 2019-06-16 ENCOUNTER — Telehealth: Payer: Self-pay | Admitting: *Deleted

## 2019-06-16 NOTE — Telephone Encounter (Signed)
   [facility] Pre-operative Risk Assessment    Request for surgical clearance:  1. What type of surgery is being performed? LEFT KNEE SCOPE   2. When is this surgery scheduled? TBD   3. What type of clearance is required (medical clearance vs. Pharmacy clearance to hold med vs. Both)? MEDICAL  4. Are there any medications that need to be held prior to surgery and how long? ASA    5. Practice name and name of physician performing surgery? [facility]; DR. Percell Miller   6. What is your office phone number [phone]    7.   What is your office fax number [phone]  8.   Anesthesia type (None, local, MAC, general) ? CHOICE   Julaine Hua 06/16/2019, 1:22 PM  _________________________________________________________________   (provider comments below)

## 2019-06-16 NOTE — Telephone Encounter (Signed)
Patient with hx of CAD s/p prior inferior MI treated with stenting to the RCA, hyperlipidemia.  Cardiac Catheterization in 2015 demonstrated a patent RCA stent.  I attempted to call the patient to follow-up on his current clinical status.  No answer, left voicemail for him to call back.  If he is not having any current cardiac symptoms he can be cleared for the procedure.  Regarding ASA therapy, we recommend continuation of ASA throughout the perioperative period.  However, if the surgeon feels that cessation of ASA is required in the perioperative period, it may be stopped 5-7 days prior to surgery with a plan to resume it as soon as felt to be feasible from a surgical standpoint in the post-operative period.

## 2019-06-17 NOTE — Telephone Encounter (Signed)
Follow up ° ° ° °Patient is returning call in reference to pre-op clearance.  °

## 2019-06-17 NOTE — Telephone Encounter (Signed)
   Primary Cardiologist: Sherren Mocha, MD  Chart reviewed as part of pre-operative protocol coverage. Given past medical history and time since last visit, based on ACC/AHA guidelines, JEFF GRUBE would be at acceptable risk for the planned procedure without further cardiovascular testing.   Regarding ASA therapy, we recommend continuation of ASA throughout the perioperative period.  However, if the surgeon feels that cessation of ASA is required in the perioperative period, it may be stopped 5-7 days prior to surgery with a plan to resume it as soon as felt to be feasible from a surgical standpoint in the post-operative period  I will route this recommendation to the requesting party via Epic fax function and remove from pre-op pool.  Please call with questions.  Kathyrn Drown, NP 06/17/2019, 4:12 PM

## 2019-10-17 ENCOUNTER — Other Ambulatory Visit: Payer: Self-pay | Admitting: Cardiovascular Disease

## 2019-11-29 ENCOUNTER — Other Ambulatory Visit: Payer: Self-pay | Admitting: Cardiovascular Disease

## 2020-02-03 ENCOUNTER — Telehealth: Payer: Self-pay

## 2020-02-03 NOTE — Telephone Encounter (Signed)
**Note De-Identified [name] Obfuscation** I called [facility] and s/w Sharyn Lull as I need ins. info off card used when running this RX in order to do this Rosuvastatin PA. Per Sharyn Lull the pt picked up his Rosuvastatin on 5/3 and his ins paid for it. No PA is need at this time.

## 2020-02-09 ENCOUNTER — Other Ambulatory Visit: Payer: Self-pay | Admitting: Cardiovascular Disease

## 2020-02-09 ENCOUNTER — Other Ambulatory Visit: Payer: Self-pay

## 2020-02-09 MED ORDER — ROSUVASTATIN CALCIUM 20 MG PO TABS: ORAL_TABLET | ORAL | 0 refills | Status: DC

## 2020-02-29 NOTE — Progress Notes (Signed)
Cardiology Office Note   Date:  03/01/2020   ID:  [name], DOB Nov 24, 1960, MRN [id number]  PCP:  Maurice Contras, MD  Cardiologist:  Maurice. Burt Knack, MD   No chief complaint on file.     History of Present Illness: [name] is a 59 y.o. male who presents for one year follow up, seen for Maurice. Burt Ochoa.   [name] has a hx of coronary artery disease s/p prior inferior MI treated with stenting to the RCA (2005/2006), hyperlipidemia. Last LHC in 2015 demonstrated a patent RCA stent. He was lost in follow up for quite some time and was last seen by Maurice Dopp PA-C 02/04/2019 and reported that he had stopped smoking and was breathing better.   Today he presents for follow up and states that he has been doing very well over the last year.  He denies anginal symptoms, shortness of breath, LE edema, palpitations, PND, orthopnea, dizziness or syncope.  He continues to not smoke and was congratulated.  His biggest complaint is his left knee for which he needs to undergo replacement which is not yet scheduled.  He continues to work full-time at a very physical job without anginal symptoms.  Reports medication compliance.  BP is stable today at 110/74. Overall very stable.   Past Medical History:  Diagnosis Date  . Arthritis   . Coronary artery disease   . History of heart attack    seen by [name]  . Hyperlipidemia   . Hypertension   . Myocardial infarction [facility]) 2005-2006?   stent 07    Past Surgical History:  Procedure Laterality Date  . bicep surgery      right bicep   . CARDIAC CATHETERIZATION  2008,07  . ELBOW SURGERY     left elbow   . LEFT HEART CATHETERIZATION WITH CORONARY ANGIOGRAM N/A 03/29/2014   Procedure: LEFT HEART CATHETERIZATION WITH CORONARY ANGIOGRAM;  Surgeon: Maurice Ohara, MD;  Location: [facility] CATH LAB;  Service: Cardiovascular;  Laterality: N/A;  . MASS EXCISION  09/11/2011   Procedure: EXCISION MASS;  Surgeon: Maurice Curry, MD;  Location: [place];  Service: General;  Laterality: Left;  excision of left groin subcutaenous mass  . NECK SURGERY  2009  . SHOULDER ARTHROSCOPY    . WRIST ARTHROSCOPY WITH DEBRIDEMENT  09/27/2012   Procedure: WRIST ARTHROSCOPY WITH DEBRIDEMENT;  Surgeon: Maurice Sours, MD;  Location: [place];  Service: Orthopedics;  Laterality: Left;  LEFT WRIST ARTHROSCOPY DEBRIDEMENT  SHRINKAGE TRIANGULAR FIBROCARTILAGE COMPLEX/ LIGAMENT      Current Outpatient Medications  Medication Sig Dispense Refill  . aspirin 81 MG tablet Take 81 mg by mouth daily.     . metoprolol succinate (TOPROL-XL) 25 MG 24 hr tablet TAKE 1 TABLET BY MOUTH DAILY 90 tablet 3  . Multiple Vitamin (MULTIVITAMIN) tablet Take 1 tablet by mouth daily.    . nitroGLYCERIN (NITROSTAT) 0.4 MG SL tablet Place 1 tablet (0.4 mg total) under the tongue every 5 (five) minutes as needed. DISSOLVE 1 TAB UNDER TONGUE EVERY 5 MINUTES AS NEEDED FOR CHEST PAIN MAY REPEAT 3 TIMES 25 tablet 2  . Omega-3 Fatty Acids (FISH OIL) 1000 MG CAPS Take 3 capsules by mouth daily.     . rosuvastatin (CRESTOR) 20 MG tablet TAKE 1 TABLET BY MOUTH DAILY. PLEASE MAKE OVERDUE ANNUAL APPT FOR FUTURE REFILLS [phone] 3rd and final attempt 15 tablet 0   No current facility-administered medications for  this visit.    Allergies:   Percocet [oxycodone-acetaminophen] and Oxycodone-acetaminophen    Social History:  The patient  reports that he quit smoking about 4 years ago. He smoked 1.00 pack per day. He has never used smokeless tobacco. He reports that he does not drink alcohol or use drugs.   Family History:  The patient's family history includes Aneurysm in his father; Cancer in his maternal uncle; Heart attack in his mother; Heart disease in his mother.   ROS:  Please see the history of present illness.   Otherwise, review of systems are positive for none.  All other systems are reviewed and negative.   PHYSICAL EXAM: VS:  BP 110/74   Pulse 82   Ht 5'  6" (1.676 m)   Wt 167 lb (75.8 kg)   SpO2 97%   BMI 26.95 kg/m  , BMI Body mass index is 26.95 kg/m.   General: Well developed, well nourished, NAD Neck: Negative for carotid bruits. No JVD Lungs:Clear to ausculation bilaterally. No wheezes, rales, or rhonchi. Breathing is unlabored. Cardiovascular: RRR with S1 S2. No murmurs, rubs, gallops, or LV heave appreciated. Extremities: No edema. Radial pulses 2+ bilaterally Neuro: Alert and oriented. No focal deficits. No facial asymmetry. MAE spontaneously. Psych: Responds to questions appropriately with normal affect.     EKG:  EKG is ordered today. The ekg ordered today demonstrates NSR with no change from prior tracing    Recent Labs: No results found for requested labs within last 8760 hours.    Lipid Panel    Component Value Date/Time   CHOL 117 05/24/2018 0726   TRIG 76 05/24/2018 0726   HDL 40 05/24/2018 0726   CHOLHDL 2.9 05/24/2018 0726   CHOLHDL 3.1 07/23/2015 0734   VLDL 11 07/23/2015 0734   LDLCALC 62 05/24/2018 0726   LDLDIRECT 141.1 09/10/2009 1037    Wt Readings from Last 3 Encounters:  03/01/20 167 lb (75.8 kg)  05/03/19 164 lb (74.4 kg)  02/04/19 163 lb 6.4 oz (74.1 kg)     Other studies Reviewed: Additional studies/ records that were reviewed today include:   Review of the above records demonstrates:   Echo 02/11/18 EF 50-55, no RWMA, Gr 2 DD, trivial eff  Nuclear (03/14/14):  Intermediate risk, EF 52%, apical inferior and mid inferior perfusion defect with partial reversibility.  LHC (03/29/14):  OM1 20-30%, proximal RCA stent 30-40% ISR, EF 55% with inferior hypokinesis (mild). >>>Med Rx  Echo (04/2006):  EF 55-60%  ASSESSMENT AND PLAN:  1. Coronary artery disease without angina pectoris: -Remote history of inferior MI with PCI to RCA -Last echocardiogram from 01/2018 with normal LV function and moderate DD -Denies anginal symptoms -Continue ASA, Toprol XL and statin therapies -We will  repeat lab work today -May need Lexiscan stress test given duration from last ischemic evaluation prior to knee surgery however would discuss with Maurice. Burt Ochoa  2. Hyperlipidemia: -Last LDL, 62 on 05/24/2018 -Continue Crestor 20 mg p.o. daily -Obtain repeat lipid panel, LFTs   3.  History of tobacco use: -Continues cessation>> congratulated   Current medicines are reviewed at length with the patient today.  The patient does not have concerns regarding medicines.  The following changes have been made:  no change  Labs/ tests ordered today include: Lipid, CMET  Orders Placed This Encounter  Procedures  . Lipid panel  . CBC  . Comprehensive metabolic panel  . EKG 12-Lead    Disposition:   FU with Maurice. Burt Ochoa  in 1 year  Signed, Kathyrn Drown, NP  03/01/2020 2:52 PM    [facility] [place], [place], [place]  [address] Phone: [phone]; Fax: [phone]

## 2020-03-01 ENCOUNTER — Ambulatory Visit (INDEPENDENT_AMBULATORY_CARE_PROVIDER_SITE_OTHER): Payer: Managed Care, Other (non HMO) | Admitting: Cardiology

## 2020-03-01 ENCOUNTER — Encounter: Payer: Self-pay | Admitting: Cardiology

## 2020-03-01 ENCOUNTER — Other Ambulatory Visit: Payer: Self-pay

## 2020-03-01 VITALS — BP 110/74 | HR 82 | Ht 66.0 in | Wt 167.0 lb

## 2020-03-01 DIAGNOSIS — E7800 Pure hypercholesterolemia, unspecified: Secondary | ICD-10-CM | POA: Diagnosis not present

## 2020-03-01 DIAGNOSIS — I2510 Atherosclerotic heart disease of native coronary artery without angina pectoris: Secondary | ICD-10-CM

## 2020-03-01 DIAGNOSIS — E785 Hyperlipidemia, unspecified: Secondary | ICD-10-CM | POA: Diagnosis not present

## 2020-03-01 NOTE — Patient Instructions (Signed)
Medication Instructions:   Your physician recommends that you continue on your current medications as directed. Please refer to the Current Medication list given to you today.  *If you need a refill on your cardiac medications before your next appointment, please call your pharmacy*  Lab Work:  Your physician recommends that you return for lab work on Monday.  If you have labs (blood work) drawn today and your tests are completely normal, you will receive your results only by: Marland Kitchen MyChart Message (if you have MyChart) OR . A paper copy in the mail If you have any lab test that is abnormal or we need to change your treatment, we will call you to review the results.  Testing/Procedures:  None ordered today  Follow-Up: At [facility], you and your health needs are our priority.  As part of our continuing mission to provide you with exceptional heart care, we have created designated Provider Care Teams.  These Care Teams include your primary Cardiologist (physician) and Advanced Practice Providers (APPs -  Physician Assistants and Nurse Practitioners) who all work together to provide you with the care you need, when you need it.  We recommend signing up for the patient portal called "MyChart".  Sign up information is provided on this After Visit Summary.  MyChart is used to connect with patients for Virtual Visits (Telemedicine).  Patients are able to view lab/test results, encounter notes, upcoming appointments, etc.  Non-urgent messages can be sent to your provider as well.   To learn more about what you can do with MyChart, go to [email or web address].    Your next appointment:   12 month(s)  The format for your next appointment:   In Person  Provider:   Sherren Mocha, MD

## 2020-03-05 ENCOUNTER — Other Ambulatory Visit: Payer: Managed Care, Other (non HMO)

## 2020-03-05 ENCOUNTER — Other Ambulatory Visit: Payer: Self-pay

## 2020-03-05 DIAGNOSIS — I2510 Atherosclerotic heart disease of native coronary artery without angina pectoris: Secondary | ICD-10-CM

## 2020-03-05 DIAGNOSIS — E7800 Pure hypercholesterolemia, unspecified: Secondary | ICD-10-CM

## 2020-03-05 DIAGNOSIS — E785 Hyperlipidemia, unspecified: Secondary | ICD-10-CM

## 2020-03-05 LAB — LIPID PANEL
Chol/HDL Ratio: 3.1 ratio (ref 0.0–5.0)
Cholesterol, Total: 114 mg/dL (ref 100–199)
HDL: 37 mg/dL — ABNORMAL LOW (ref >39)
LDL Chol Calc (NIH): 58 mg/dL (ref 0–99)
Triglycerides: 102 mg/dL (ref 0–149)
VLDL Cholesterol Cal: 19 mg/dL (ref 5–40)

## 2020-03-05 LAB — CBC
Hematocrit: 40.3 % (ref 37.5–51.0)
Hemoglobin: 13.6 g/dL (ref 13.0–17.7)
MCH: 30.8 pg (ref 26.6–33.0)
MCHC: 33.7 g/dL (ref 31.5–35.7)
MCV: 91 fL (ref 79–97)
Platelets: 144 10*3/uL — ABNORMAL LOW (ref 150–450)
RBC: 4.42 x10E6/uL (ref 4.14–5.80)
RDW: 13 % (ref 11.6–15.4)
WBC: 5.7 10*3/uL (ref 3.4–10.8)

## 2020-03-05 LAB — COMPREHENSIVE METABOLIC PANEL
ALT: 29 IU/L (ref 0–44)
AST: 21 IU/L (ref 0–40)
Albumin/Globulin Ratio: 2.3 — ABNORMAL HIGH (ref 1.2–2.2)
Albumin: 4.2 g/dL (ref 3.8–4.9)
Alkaline Phosphatase: 60 IU/L (ref 48–121)
BUN/Creatinine Ratio: 18 (ref 9–20)
BUN: 17 mg/dL (ref 6–24)
Bilirubin Total: 0.3 mg/dL (ref 0.0–1.2)
CO2: 24 mmol/L (ref 20–29)
Calcium: 8.6 mg/dL — ABNORMAL LOW (ref 8.7–10.2)
Chloride: 108 mmol/L — ABNORMAL HIGH (ref 96–106)
Creatinine, Ser: 0.94 mg/dL (ref 0.76–1.27)
GFR calc Af Amer: 103 mL/min/{1.73_m2} (ref >59)
GFR calc non Af Amer: 89 mL/min/{1.73_m2} (ref >59)
Globulin, Total: 1.8 g/dL (ref 1.5–4.5)
Glucose: 130 mg/dL — ABNORMAL HIGH (ref 65–99)
Potassium: 4.7 mmol/L (ref 3.5–5.2)
Sodium: 144 mmol/L (ref 134–144)
Total Protein: 6 g/dL (ref 6.0–8.5)

## 2020-03-06 ENCOUNTER — Telehealth: Payer: Self-pay | Admitting: Cardiovascular Disease

## 2020-03-06 ENCOUNTER — Telehealth: Payer: Self-pay | Admitting: Cardiology

## 2020-03-06 NOTE — Telephone Encounter (Signed)
The patient has been notified of the result and verbalized understanding.  All questions (if any) were answered. Mady Haagensen, [place] 03/06/2020 1:59 PM

## 2020-03-06 NOTE — Telephone Encounter (Signed)
Patient is calling for lab results.

## 2020-03-06 NOTE — Telephone Encounter (Signed)
The patient has been notified of the result and verbalized understanding.  All questions (if any) were answered. Mady Haagensen, [place] 03/06/2020 1:58 PM

## 2020-03-06 NOTE — Telephone Encounter (Signed)
New Message  Pt is calling to receive his results  Please call back

## 2020-03-12 ENCOUNTER — Telehealth: Payer: Self-pay | Admitting: Cardiovascular Disease

## 2020-03-12 NOTE — Telephone Encounter (Signed)
Per [place] office visit note:  -May need Lexiscan stress test given duration from last ischemic evaluation prior to knee surgery however would discuss with Dr. Burt Knack  Pt is calling wanting his approval to move ahead with knee surgery sent to [facility].  His surgery has not been set up as of yet.  Advised pt generally once the surgical date has been set they will request cardiac/surgical clearance as necessary.  In the meanwhile I will follow up with Sharee Pimple to see if the patient will need a Lexiscan prior to surgical clearance.   Pt states understanding and was grateful for the call and information.

## 2020-03-12 NOTE — Telephone Encounter (Signed)
New Message   Pt is calling and says he had lab work done a week ago and is wondering why this has not been sent to [facility] for him to have a procedure    Please call back

## 2020-03-13 ENCOUNTER — Telehealth: Payer: Self-pay | Admitting: Cardiology

## 2020-03-13 NOTE — Telephone Encounter (Signed)
Kathyrn Drown D, NP 11 minutes ago (4:02 PM)     Hi!  Just spoke with Dr. Burt Knack and he says that the patient will not need to undergo stress testing prior to surgery.   Thank you  Sharee Pimple

## 2020-03-13 NOTE — Telephone Encounter (Signed)
Dr. Darcus Austin this patient in follow up and he was doing very well without symptoms. He has a hx of inferior MI treated with stenting to the RCA (2005/2006), hyperlipidemia.Last [facility] 2015 demonstrated a patent RCA stent. He is to undergo knee surgery. Would you prefer Lexiscan study for surveillance prior to anesthesia?   Thank you  Sharee Pimple

## 2020-03-13 NOTE — Telephone Encounter (Signed)
Left message for pt - no stress testing necessary prior to upcoming surgery.

## 2020-03-13 NOTE — Telephone Encounter (Signed)
Hi!  Just spoke with Dr. Burt Knack and he says that the patient will not need to undergo stress testing prior to surgery.   Thank you  Sharee Pimple

## 2020-03-14 NOTE — Telephone Encounter (Signed)
Detailed message left that pt does not require stress testing prior to surgery ./cy

## 2020-04-19 ENCOUNTER — Other Ambulatory Visit: Payer: Self-pay | Admitting: Cardiovascular Disease

## 2020-05-06 ENCOUNTER — Other Ambulatory Visit: Payer: Self-pay | Admitting: Physician Assistant

## 2021-03-27 NOTE — Progress Notes (Signed)
Cardiology Office Note    Date:  04/09/2021   ID:  [name], DOB 1960/10/23, MRN [id number]   PCP:  Antony Contras, MD   [facility]  Cardiologist:  Sherren Mocha, MD   Advanced Practice Provider:  No care team member to display Electrophysiologist:  None   [phone]   Chief Complaint  Patient presents with   Follow-up     History of Present Illness:  [name] is a 60 y.o. male hx of coronary artery disease s/p prior inferior MI treated with stenting to the RCA (2005/2006), hyperlipidemia. Last LHC in 2015 demonstrated a patent RCA stent.   Patient last saw Kathyrn Drown, NP 03/01/2020 and was going to need knee surgery but no further testing needed.  Patient comes in for f/u. Doing well. Denies chest pain, dyspnea, dizziness or presyncope. Quit smoking 3 years ago. Works on a loading dock lifting, loading trucks. No exercise outside of work. Does yard work. May retire in Oct.  Past Medical History:  Diagnosis Date   Arthritis    Coronary artery disease    History of heart attack    seen by Dr Lia Foyer   Hyperlipidemia    Hypertension    Myocardial infarction [facility]) 2005-2006?   stent 07    Past Surgical History:  Procedure Laterality Date   bicep surgery      right bicep    CARDIAC CATHETERIZATION  2008,07   ELBOW SURGERY     left elbow    LEFT HEART CATHETERIZATION WITH CORONARY ANGIOGRAM N/A 03/29/2014   Procedure: LEFT HEART CATHETERIZATION WITH CORONARY ANGIOGRAM;  Surgeon: Blane Ohara, MD;  Location: [facility] CATH LAB;  Service: Cardiovascular;  Laterality: N/A;   MASS EXCISION  09/11/2011   Procedure: EXCISION MASS;  Surgeon: Gayland Curry, MD;  Location: [facility];  Service: General;  Laterality: Left;  excision of left groin subcutaenous mass   NECK SURGERY  2009   SHOULDER ARTHROSCOPY     WRIST ARTHROSCOPY WITH DEBRIDEMENT  09/27/2012   Procedure: WRIST ARTHROSCOPY WITH DEBRIDEMENT;  Surgeon: Wynonia Sours,  MD;  Location: [place];  Service: Orthopedics;  Laterality: Left;  LEFT WRIST ARTHROSCOPY DEBRIDEMENT  SHRINKAGE TRIANGULAR FIBROCARTILAGE COMPLEX/ LIGAMENT     Current Medications: Current Meds  Medication Sig   aspirin 81 MG tablet Take 81 mg by mouth daily.    metoprolol succinate (TOPROL-XL) 25 MG 24 hr tablet TAKE 1 TABLET BY MOUTH DAILY   Multiple Vitamin (MULTIVITAMIN) tablet Take 1 tablet by mouth daily.   nitroGLYCERIN (NITROSTAT) 0.4 MG SL tablet Place 1 tablet (0.4 mg total) under the tongue every 5 (five) minutes as needed. DISSOLVE 1 TAB UNDER TONGUE EVERY 5 MINUTES AS NEEDED FOR CHEST PAIN MAY REPEAT 3 TIMES   Omega-3 Fatty Acids (FISH OIL) 1000 MG CAPS Take 3 capsules by mouth daily.    rosuvastatin (CRESTOR) 20 MG tablet Take 1 tablet (20 mg total) by mouth daily. TAKE 1 TABLET BY MOUTH DAILY     Allergies:   Percocet [oxycodone-acetaminophen] and Oxycodone-acetaminophen   Social History   Socioeconomic History   Marital status: Married    Spouse name: Not on file   Number of children: Not on file   Years of education: Not on file   Highest education level: Not on file  Occupational History   Not on file  Tobacco Use   Smoking status: Former    Packs/day: 1.00  Pack years: 0.00    Types: Cigarettes    Quit date: 10/31/2015    Years since quitting: 5.4   Smokeless tobacco: Never  Vaping Use   Vaping Use: Never used  Substance and Sexual Activity   Alcohol use: No   Drug use: No   Sexual activity: Not on file  Other Topics Concern   Not on file  Social History Narrative   Not on file   Social Determinants of Health   Financial Resource Strain: Not on file  Food Insecurity: Not on file  Transportation Needs: Not on file  Physical Activity: Not on file  Stress: Not on file  Social Connections: Not on file     Family History:  The patient's  family history includes Aneurysm in his father; Cancer in his maternal uncle; Heart attack in  his mother; Heart disease in his mother.   ROS:   Please see the history of present illness.    ROS All other systems reviewed and are negative.   PHYSICAL EXAM:   VS:  BP 136/80   Pulse 69   Ht 5\' 6"  (1.676 m)   Wt 176 lb 12.8 oz (80.2 kg)   SpO2 97%   BMI 28.54 kg/m   Physical Exam  GEN: Well nourished, well developed, in no acute distress   Neck: no JVD, carotid bruits, or masses Cardiac:RRR; no murmurs, rubs, or gallops  Respiratory:  clear to auscultation bilaterally, normal work of breathing GI: soft, nontender, nondistended, + BS Ext: without cyanosis, clubbing, or edema, Good distal pulses bilaterally Neuro:  Alert and Oriented x 3 Psych: euthymic mood, full affect  Wt Readings from Last 3 Encounters:  04/09/21 176 lb 12.8 oz (80.2 kg)  03/01/20 167 lb (75.8 kg)  05/03/19 164 lb (74.4 kg)      Studies/Labs Reviewed:   EKG:  EKG is ordered today.  The ekg ordered today demonstrates NSR with nonspecific ST changes, no acute change.  Recent Labs: No results found for requested labs within last 8760 hours.   Lipid Panel    Component Value Date/Time   CHOL 114 03/05/2020 0732   TRIG 102 03/05/2020 0732   HDL 37 (L) 03/05/2020 0732   CHOLHDL 3.1 03/05/2020 0732   CHOLHDL 3.1 07/23/2015 0734   VLDL 11 07/23/2015 0734   LDLCALC 58 03/05/2020 0732   LDLDIRECT 141.1 09/10/2009 1037    Additional studies/ records that were reviewed today include:  Echo 02/11/18 EF 50-55, no RWMA, Gr 2 DD, trivial eff   Nuclear (03/14/14):   Intermediate risk, EF 52%, apical inferior and mid inferior perfusion defect with partial reversibility.   LHC (03/29/14): OM1 20-30%, proximal RCA stent 30-40% ISR, EF 55% with inferior hypokinesis (mild). >>> Med Rx   Echo (04/2006):   EF 55-60%   Risk Assessment/Calculations:         ASSESSMENT:    1. Coronary artery disease involving native coronary artery of native heart without angina pectoris   2. HYPERCHOLESTEROLEMIA   3.  Essential hypertension      PLAN:  In order of problems listed above:  Coronary artery disease s/p prior inferior MI treated with stenting to the RCA (2005/2006Last LHC in 2015 demonstrated a patent RCA stent.  On aspirin, statin and Toprol-no angina, check labs  Hyperlipidemia on Crestor-check labs  HTN-BP controlled  Shared Decision Making/Informed Consent        Medication Adjustments/Labs and Tests Ordered: Current medicines are reviewed at length with the patient today.  Concerns regarding medicines are outlined above.  Medication changes, Labs and Tests ordered today are listed in the Patient Instructions below. There are no Patient Instructions on file for this visit.   Sumner Boast, PA-C  04/09/2021 8:27 AM    [facility] [place], [place], [place]  [address] Phone: [phone]; Fax: [phone]

## 2021-04-09 ENCOUNTER — Ambulatory Visit (INDEPENDENT_AMBULATORY_CARE_PROVIDER_SITE_OTHER): Payer: Managed Care, Other (non HMO) | Admitting: Physician Assistant

## 2021-04-09 ENCOUNTER — Encounter: Payer: Self-pay | Admitting: Physician Assistant

## 2021-04-09 ENCOUNTER — Other Ambulatory Visit: Payer: Self-pay

## 2021-04-09 VITALS — BP 136/80 | HR 69 | Ht 66.0 in | Wt 176.8 lb

## 2021-04-09 DIAGNOSIS — E7800 Pure hypercholesterolemia, unspecified: Secondary | ICD-10-CM

## 2021-04-09 DIAGNOSIS — I2510 Atherosclerotic heart disease of native coronary artery without angina pectoris: Secondary | ICD-10-CM | POA: Diagnosis not present

## 2021-04-09 DIAGNOSIS — I10 Essential (primary) hypertension: Secondary | ICD-10-CM

## 2021-04-09 LAB — COMPREHENSIVE METABOLIC PANEL
ALT: 64 IU/L — ABNORMAL HIGH (ref 0–44)
AST: 41 IU/L — ABNORMAL HIGH (ref 0–40)
Albumin/Globulin Ratio: 2.3 — ABNORMAL HIGH (ref 1.2–2.2)
Albumin: 4.8 g/dL (ref 3.8–4.9)
Alkaline Phosphatase: 83 IU/L (ref 44–121)
BUN/Creatinine Ratio: 11 (ref 9–20)
BUN: 11 mg/dL (ref 6–24)
Bilirubin Total: 0.7 mg/dL (ref 0.0–1.2)
CO2: 24 mmol/L (ref 20–29)
Calcium: 9.6 mg/dL (ref 8.7–10.2)
Chloride: 104 mmol/L (ref 96–106)
Creatinine, Ser: 1.01 mg/dL (ref 0.76–1.27)
Globulin, Total: 2.1 g/dL (ref 1.5–4.5)
Glucose: 131 mg/dL — ABNORMAL HIGH (ref 65–99)
Potassium: 4.7 mmol/L (ref 3.5–5.2)
Sodium: 143 mmol/L (ref 134–144)
Total Protein: 6.9 g/dL (ref 6.0–8.5)
eGFR: 86 mL/min/{1.73_m2} (ref >59)

## 2021-04-09 LAB — LIPID PANEL
Chol/HDL Ratio: 3.7 ratio (ref 0.0–5.0)
Cholesterol, Total: 130 mg/dL (ref 100–199)
HDL: 35 mg/dL — ABNORMAL LOW (ref >39)
LDL Chol Calc (NIH): 69 mg/dL (ref 0–99)
Triglycerides: 149 mg/dL (ref 0–149)
VLDL Cholesterol Cal: 26 mg/dL (ref 5–40)

## 2021-04-09 LAB — CBC
Hematocrit: 43.5 % (ref 37.5–51.0)
Hemoglobin: 14.4 g/dL (ref 13.0–17.7)
MCH: 30.4 pg (ref 26.6–33.0)
MCHC: 33.1 g/dL (ref 31.5–35.7)
MCV: 92 fL (ref 79–97)
Platelets: 179 10*3/uL (ref 150–450)
RBC: 4.74 x10E6/uL (ref 4.14–5.80)
RDW: 13.2 % (ref 11.6–15.4)
WBC: 7.4 10*3/uL (ref 3.4–10.8)

## 2021-04-09 NOTE — Patient Instructions (Signed)
Medication Instructions:  Your physician recommends that you continue on your current medications as directed. Please refer to the Current Medication list given to you today.  *If you need a refill on your cardiac medications before your next appointment, please call your pharmacy*   Lab Work: TODAY: CMET, CBC, FLP  If you have labs (blood work) drawn today and your tests are completely normal, you will receive your results only by: [place] (if you have MyChart) OR A paper copy in the mail If you have any lab test that is abnormal or we need to change your treatment, we will call you to review the results.  Follow-Up: At [facility], you and your health needs are our priority.  As part of our continuing mission to provide you with exceptional heart care, we have created designated Provider Care Teams.  These Care Teams include your primary Cardiologist (physician) and Advanced Practice Providers (APPs -  Physician Assistants and Nurse Practitioners) who all work together to provide you with the care you need, when you need it.   Your next appointment:   1 year(s)  The format for your next appointment:   In Person  Provider:   You may see Sherren Mocha, MD or one of the following Advanced Practice Providers on your designated Care Team:   Richardson Dopp, PA-C Vin [place], [place]

## 2021-05-02 ENCOUNTER — Other Ambulatory Visit: Payer: Self-pay | Admitting: Physician Assistant

## 2021-05-03 ENCOUNTER — Other Ambulatory Visit: Payer: Self-pay | Admitting: *Deleted

## 2021-05-03 MED ORDER — ROSUVASTATIN CALCIUM 20 MG PO TABS: 20.0000 mg | ORAL_TABLET | Freq: Every day | ORAL | 3 refills | Status: DC

## 2021-05-05 ENCOUNTER — Other Ambulatory Visit: Payer: Self-pay | Admitting: Cardiovascular Disease

## 2022-05-26 ENCOUNTER — Other Ambulatory Visit: Payer: Self-pay | Admitting: Cardiovascular Disease

## 2022-05-26 ENCOUNTER — Other Ambulatory Visit: Payer: Self-pay | Admitting: Physician Assistant

## 2022-06-09 ENCOUNTER — Ambulatory Visit: Payer: Managed Care, Other (non HMO) | Attending: Physician Assistant | Admitting: Physician Assistant

## 2022-06-09 ENCOUNTER — Encounter: Payer: Self-pay | Admitting: Physician Assistant

## 2022-06-09 VITALS — BP 148/90 | HR 90 | Ht 67.0 in | Wt 186.0 lb

## 2022-06-09 DIAGNOSIS — R9431 Abnormal electrocardiogram [ECG] [EKG]: Secondary | ICD-10-CM

## 2022-06-09 DIAGNOSIS — I2510 Atherosclerotic heart disease of native coronary artery without angina pectoris: Secondary | ICD-10-CM

## 2022-06-09 DIAGNOSIS — E7800 Pure hypercholesterolemia, unspecified: Secondary | ICD-10-CM

## 2022-06-09 DIAGNOSIS — I10 Essential (primary) hypertension: Secondary | ICD-10-CM | POA: Diagnosis not present

## 2022-06-09 DIAGNOSIS — E785 Hyperlipidemia, unspecified: Secondary | ICD-10-CM

## 2022-06-09 MED ORDER — METOPROLOL SUCCINATE ER 50 MG PO TB24: 50.0000 mg | ORAL_TABLET | Freq: Every day | ORAL | 0 refills | Status: DC

## 2022-06-09 NOTE — Patient Instructions (Addendum)
Medication Instructions:  INCREASE Toprol XL to '50mg'$  Take 1 tablet once a day  *If you need a refill on your cardiac medications before your next appointment, please call your pharmacy*   Lab Work: LIPIDS AND LFT on the same same day as stress test.   Testing/Procedures: Your physician has requested that you have en exercise stress myoview. For further information please visit [email or web address]. Please follow instruction sheet, as given.   Follow-Up: At [facility], you and your health needs are our priority.  As part of our continuing mission to provide you with exceptional heart care, we have created designated Provider Care Teams.  These Care Teams include your primary Cardiologist (physician) and Advanced Practice Providers (APPs -  Physician Assistants and Nurse Practitioners) who all work together to provide you with the care you need, when you need it.  We recommend signing up for the patient portal called "MyChart".  Sign up information is provided on this After Visit Summary.  MyChart is used to connect with patients for Virtual Visits (Telemedicine).  Patients are able to view lab/test results, encounter notes, upcoming appointments, etc.  Non-urgent messages can be sent to your provider as well.   To learn more about what you can do with MyChart, go to [email or web address].    Your next appointment:   AFTER STRESS TEST   The format for your next appointment:   In Person  Provider:   Robbie Lis, PA-C Other Instructions   Important Information About Sugar

## 2022-06-09 NOTE — Progress Notes (Signed)
Cardiology Office Note:    Date:  06/09/2022   ID:  [name], DOB 1961-08-15, MRN [id number]  PCP:  Antony Contras, MD  [facility] Cardiologist:  Sherren Mocha, MD  [facility] Electrophysiologist:  None   Chief Complaint: yearly followup  History of Present Illness:    KOSEI RHODES is a 61 y.o. male with a hx of  coronary artery disease s/p prior inferior MI treated with stenting to the RCA (2005/2006), hyperlipidemia seen for follow up.  Last LHC in 2015 demonstrated a patent RCA stent. Echo 01/2018 with LVEF of 50-55% and grade 2 DD.   Patient is here for follow-up.  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 No exertional chest pain, shortness of breath, orthopnea, PND, syncope, lower extremity edema or melena.  No regular exercise.  2 months ago he had a episode of sudden diaphoresis and nausea which was similar to his prior angina but no other symptoms.  EKG today noted to be abnormal.  Past Medical History:  Diagnosis Date   Arthritis    Coronary artery disease    History of heart attack    seen by Dr Lia Foyer   Hyperlipidemia    Hypertension    Myocardial infarction [facility]) 2005-2006?   stent 07    Past Surgical History:  Procedure Laterality Date   bicep surgery      right bicep    CARDIAC CATHETERIZATION  2008,07   ELBOW SURGERY     left elbow    LEFT HEART CATHETERIZATION WITH CORONARY ANGIOGRAM N/A 03/29/2014   Procedure: LEFT HEART CATHETERIZATION WITH CORONARY ANGIOGRAM;  Surgeon: Blane Ohara, MD;  Location: [facility] CATH LAB;  Service: Cardiovascular;  Laterality: N/A;   MASS EXCISION  09/11/2011   Procedure: EXCISION MASS;  Surgeon: Gayland Curry, MD;  Location: [place];  Service: General;  Laterality: Left;  excision of left groin subcutaenous mass   NECK SURGERY  2009   SHOULDER ARTHROSCOPY     WRIST ARTHROSCOPY WITH DEBRIDEMENT  09/27/2012   Procedure: WRIST ARTHROSCOPY WITH DEBRIDEMENT;  Surgeon: Wynonia Sours, MD;  Location: [place];  Service: Orthopedics;  Laterality: Left;  LEFT WRIST ARTHROSCOPY DEBRIDEMENT  SHRINKAGE TRIANGULAR FIBROCARTILAGE COMPLEX/ LIGAMENT     Current Medications: Current Meds  Medication Sig   aspirin 81 MG tablet Take 81 mg by mouth daily.    Multiple Vitamin (MULTIVITAMIN) tablet Take 1 tablet by mouth daily.   nitroGLYCERIN (NITROSTAT) 0.4 MG SL tablet Place 1 tablet (0.4 mg total) under the tongue every 5 (five) minutes as needed. DISSOLVE 1 TAB UNDER TONGUE EVERY 5 MINUTES AS NEEDED FOR CHEST PAIN MAY REPEAT 3 TIMES   Omega-3 Fatty Acids (FISH OIL) 1000 MG CAPS Take 3 capsules by mouth daily.    rosuvastatin (CRESTOR) 20 MG tablet TAKE 1 TABLET DAILY   [DISCONTINUED] metoprolol succinate (TOPROL-XL) 25 MG 24 hr tablet TAKE 1 TABLET BY MOUTH DAILY     Allergies:   Percocet [oxycodone-acetaminophen] and Oxycodone-acetaminophen   Social History   Socioeconomic History   Marital status: Married    Spouse name: Not on file   Number of children: Not on file   Years of education: Not on file   Highest education level: Not on file  Occupational History   Not on file  Tobacco Use   Smoking status: Former    Packs/day: 1.00    Types: Cigarettes    Quit date: 10/31/2015    Years  since quitting: 6.6   Smokeless tobacco: Never  Vaping Use   Vaping Use: Never used  Substance and Sexual Activity   Alcohol use: No   Drug use: No   Sexual activity: Not on file  Other Topics Concern   Not on file  Social History Narrative   Not on file   Social Determinants of Health   Financial Resource Strain: Not on file  Food Insecurity: Not on file  Transportation Needs: Not on file  Physical Activity: Not on file  Stress: Not on file  Social Connections: Not on file     Family History: The patient's family history includes Aneurysm in his father; Cancer in his maternal uncle; Heart attack in his mother; Heart disease in his mother.    ROS:   Please see the history of  present illness.    All other systems reviewed and are negative.  EKGs/Labs/Other Studies Reviewed:    The following studies were reviewed today:  Echo 01/2018 Left ventricle: The cavity size was normal. Wall thickness was    normal. Systolic function was normal. The estimated ejection    fraction was in the range of 50% to 55%. Wall motion was normal;    there were no regional wall motion abnormalities. Features are    consistent with a pseudonormal left ventricular filling pattern,    with concomitant abnormal relaxation and increased filling    pressure (grade 2 diastolic dysfunction).  - Pericardium, extracardiac: A trivial pericardial effusion was    identified.   Coronary angiography: 2015 Coronary dominance: right   Left mainstem: The left main is patent. There is no obstructive disease the   Left anterior descending (LAD): The LAD is a large vessel. There is no obstructive disease noted. There is minor diffuse calcification. The diagonal branches are patent.   Left circumflex (LCx): The left circumflex is patent. The first OM branch is patent with mild 20-30% stenosis. The AV circumflex beyond the OM is small without significant disease.   Right coronary artery (RCA): The RCA is dominant. There is a long stent in the proximal vessel. The stent has 30-40% diffuse mid in-stent restenosis. There is no high-grade obstruction noted. The PDA and PLA branches are patent. The remainder of the RCA is smooth throughout its course.   Left ventriculography: There is mild hypokinesis of the inferior wall. The remaining LV wall segments are normal. The estimated LVEF is 55%.   Final Conclusions:   1. Continued patency of the RCA stent with mild diffuse in-stent restenosis 2. No significant obstructive disease the left main, LAD, left circumflex. 3. Mild contraction abnormality of the LV with preserved overall LV systolic function.   Recommendations: Continue medical therapy and risk  reduction measures.    EKG:  EKG is  ordered today.  The ekg ordered today demonstrates sinus rhythm, ST/T wave changes in inferior and anterior lateral leads.  Recent Labs: No results found for requested labs within last 365 days.  Recent Lipid Panel    Component Value Date/Time   CHOL 130 04/09/2021 0832   TRIG 149 04/09/2021 0832   HDL 35 (L) 04/09/2021 0832   CHOLHDL 3.7 04/09/2021 0832   CHOLHDL 3.1 07/23/2015 0734   VLDL 11 07/23/2015 0734   LDLCALC 69 04/09/2021 0832   LDLDIRECT 141.1 09/10/2009 1037     Physical Exam:    VS:  BP (!) 148/90   Pulse 90   Ht '5\' 7"'$  (1.702 m)   Wt 186 lb (84.4  kg)   SpO2 96%   BMI 29.13 kg/m     Wt Readings from Last 3 Encounters:  06/09/22 186 lb (84.4 kg)  04/09/21 176 lb 12.8 oz (80.2 kg)  03/01/20 167 lb (75.8 kg)     GEN:  Well nourished, well developed in no acute distress HEENT: Normal NECK: No JVD; No carotid bruits LYMPHATICS: No lymphadenopathy CARDIAC: RRR, no murmurs, rubs, gallops RESPIRATORY:  Clear to auscultation without rales, wheezing or rhonchi  ABDOMEN: Soft, non-tender, non-distended MUSCULOSKELETAL:  No edema; No deformity  SKIN: Warm and dry NEUROLOGIC:  Alert and oriented x 3 PSYCHIATRIC:  Normal affect   ASSESSMENT AND PLAN:    Abnormal EKG -EKG today shows ST/T wave inversion in inferior lead and lead V3 to V6.  Chronic T wave changes in inferior lead but new in V2-6.  No regular exercise.  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  However, 2 months ago he had a sudden onset diaphoresis and nausea at rest which was similar to prior anginal symptoms. -EKG abnormality could be due to repolarization/LVH given uncontrolled BP -We will get exercise Myoview. He has knee issue but willing to try exercise. If unable to complete, convert to chemical.   2.  Hypertension -Blood pressure elevated. -Increase Toprol-XL -Advised to keep log  3.  CAD -As above -Continue aspirin statin  and beta-blocker  4.  Hyperlipidemia -Prior mildly elevated LFTs. -Check lipid panel and LFT -He denies alcohol use  5.  Prior tobacco smoking -Quit 6 years ago. Chewing nicotine gum since then.   Shared Decision Making/Informed Consent The risks [chest pain, shortness of breath, cardiac arrhythmias, dizziness, blood pressure fluctuations, myocardial infarction, stroke/transient ischemic attack, nausea, vomiting, allergic reaction, radiation exposure, metallic taste sensation and life-threatening complications (estimated to be 1 in 10,000)], benefits (risk stratification, diagnosing coronary artery disease, treatment guidance) and alternatives of a nuclear stress test were discussed in detail with Mr. Toro and he agrees to proceed.   Medication Adjustments/Labs and Tests Ordered: Current medicines are reviewed at length with the patient today.  Concerns regarding medicines are outlined above.  Orders Placed This Encounter  Procedures   Lipid panel   Hepatic function panel   Cardiac Stress Test: Informed Consent Details: Physician/Practitioner Attestation; Transcribe to consent form and obtain patient signature   MYOCARDIAL PERFUSION IMAGING   EKG 12-Lead   Meds ordered this encounter  Medications   metoprolol succinate (TOPROL-XL) 50 MG 24 hr tablet    Sig: Take 1 tablet (50 mg total) by mouth daily.    Dispense:  30 tablet    Refill:  0    Patient Instructions  Medication Instructions:  INCREASE Toprol XL to '50mg'$  Take 1 tablet once a day  *If you need a refill on your cardiac medications before your next appointment, please call your pharmacy*   Lab Work: LIPIDS AND LFT on the same same day as stress test.   Testing/Procedures: Your physician has requested that you have en exercise stress myoview. For further information please visit [email or web address]. Please follow instruction sheet, as given.   Follow-Up: At [facility], you and your health needs are our  priority.  As part of our continuing mission to provide you with exceptional heart care, we have created designated Provider Care Teams.  These Care Teams include your primary Cardiologist (physician) and Advanced Practice Providers (APPs -  Physician Assistants and Nurse Practitioners) who all work together to provide you with the care you need,  when you need it.  We recommend signing up for the patient portal called "MyChart".  Sign up information is provided on this After Visit Summary.  MyChart is used to connect with patients for Virtual Visits (Telemedicine).  Patients are able to view lab/test results, encounter notes, upcoming appointments, etc.  Non-urgent messages can be sent to your provider as well.   To learn more about what you can do with MyChart, go to [email or web address].    Your next appointment:   AFTER STRESS TEST   The format for your next appointment:   In Person  Provider:   Robbie Lis, PA-C Other Instructions   Important Information About Sugar         Jarrett Soho, PA  06/09/2022 10:46 AM    [place]

## 2022-06-17 ENCOUNTER — Telehealth (HOSPITAL_COMMUNITY): Payer: Self-pay

## 2022-06-17 NOTE — Telephone Encounter (Signed)
Spoke with the patient, detailed instructions given. He stated that he would be here for his test. Asked to call back with any questions. [name] EMTP 

## 2022-06-19 ENCOUNTER — Ambulatory Visit: Payer: Managed Care, Other (non HMO)

## 2022-06-19 ENCOUNTER — Ambulatory Visit: Payer: Managed Care, Other (non HMO) | Attending: Physician Assistant

## 2022-06-19 VITALS — Wt 186.0 lb

## 2022-06-19 DIAGNOSIS — E7800 Pure hypercholesterolemia, unspecified: Secondary | ICD-10-CM | POA: Diagnosis present

## 2022-06-19 DIAGNOSIS — R9431 Abnormal electrocardiogram [ECG] [EKG]: Secondary | ICD-10-CM | POA: Diagnosis not present

## 2022-06-19 DIAGNOSIS — I2510 Atherosclerotic heart disease of native coronary artery without angina pectoris: Secondary | ICD-10-CM | POA: Insufficient documentation

## 2022-06-19 LAB — MYOCARDIAL PERFUSION IMAGING
Angina Index: 0
Estimated workload: 7.5
Exercise duration (min): 6 min
Exercise duration (sec): 32 s
LV dias vol: 63 mL (ref 62–150)
LV sys vol: 24 mL
MPHR: 160 {beats}/min
Nuc Stress EF: 61 %
Peak HR: 146 {beats}/min
Percent HR: 91 %
Rest HR: 81 {beats}/min
Rest Nuclear Isotope Dose: 10.3 mCi
SDS: 3
SRS: 0
SSS: 3
Stress Nuclear Isotope Dose: 31.9 mCi
TID: 0.95

## 2022-06-19 LAB — LIPID PANEL
Chol/HDL Ratio: 3.8 ratio (ref 0.0–5.0)
Cholesterol, Total: 137 mg/dL (ref 100–199)
HDL: 36 mg/dL — ABNORMAL LOW (ref >39)
LDL Chol Calc (NIH): 71 mg/dL (ref 0–99)
Triglycerides: 174 mg/dL — ABNORMAL HIGH (ref 0–149)
VLDL Cholesterol Cal: 30 mg/dL (ref 5–40)

## 2022-06-19 LAB — HEPATIC FUNCTION PANEL
ALT: 103 IU/L — ABNORMAL HIGH (ref 0–44)
AST: 62 IU/L — ABNORMAL HIGH (ref 0–40)
Albumin: 4.8 g/dL (ref 3.9–4.9)
Alkaline Phosphatase: 79 IU/L (ref 44–121)
Bilirubin Total: 0.6 mg/dL (ref 0.0–1.2)
Bilirubin, Direct: 0.18 mg/dL (ref 0.00–0.40)
Total Protein: 6.8 g/dL (ref 6.0–8.5)

## 2022-06-19 MED ORDER — TECHNETIUM TC 99M TETROFOSMIN IV KIT
10.2000 | PACK | Freq: Once | INTRAVENOUS | Status: AC | PRN
  Administered 2022-06-19: 10.2 via INTRAVENOUS

## 2022-06-19 MED ORDER — TECHNETIUM TC 99M TETROFOSMIN IV KIT
31.9000 | PACK | Freq: Once | INTRAVENOUS | Status: AC | PRN
  Administered 2022-06-19: 31.9 via INTRAVENOUS

## 2022-06-23 ENCOUNTER — Telehealth: Payer: Self-pay

## 2022-06-23 NOTE — Telephone Encounter (Signed)
Left message on both home and mobile line advising the patient to contact the office to discuss lab results.

## 2022-06-23 NOTE — Telephone Encounter (Signed)
Pt is returning call.  

## 2022-06-24 NOTE — Telephone Encounter (Signed)
Spoke with the patient and his wife and gave them the test and lab results. The patients states he did not get any sleep last night because he was worried about his test results.

## 2022-07-03 ENCOUNTER — Encounter: Payer: Self-pay | Admitting: Physician Assistant

## 2022-07-03 ENCOUNTER — Other Ambulatory Visit: Payer: Self-pay | Admitting: Physician Assistant

## 2022-07-03 ENCOUNTER — Ambulatory Visit: Payer: Managed Care, Other (non HMO) | Attending: Physician Assistant | Admitting: Physician Assistant

## 2022-07-03 VITALS — BP 124/82 | HR 65 | Ht 67.0 in | Wt 186.0 lb

## 2022-07-03 DIAGNOSIS — I200 Unstable angina: Secondary | ICD-10-CM

## 2022-07-03 DIAGNOSIS — E7800 Pure hypercholesterolemia, unspecified: Secondary | ICD-10-CM | POA: Diagnosis not present

## 2022-07-03 DIAGNOSIS — R9431 Abnormal electrocardiogram [ECG] [EKG]: Secondary | ICD-10-CM

## 2022-07-03 DIAGNOSIS — I25750 Atherosclerosis of native coronary artery of transplanted heart with unstable angina: Secondary | ICD-10-CM | POA: Diagnosis not present

## 2022-07-03 DIAGNOSIS — E785 Hyperlipidemia, unspecified: Secondary | ICD-10-CM | POA: Diagnosis not present

## 2022-07-03 LAB — CBC
Hematocrit: 44.6 % (ref 37.5–51.0)
Hemoglobin: 14.7 g/dL (ref 13.0–17.7)
MCH: 30.3 pg (ref 26.6–33.0)
MCHC: 33 g/dL (ref 31.5–35.7)
MCV: 92 fL (ref 79–97)
Platelets: 174 10*3/uL (ref 150–450)
RBC: 4.85 x10E6/uL (ref 4.14–5.80)
RDW: 14.1 % (ref 11.6–15.4)
WBC: 6.1 10*3/uL (ref 3.4–10.8)

## 2022-07-03 LAB — BASIC METABOLIC PANEL
BUN/Creatinine Ratio: 15 (ref 10–24)
BUN: 15 mg/dL (ref 8–27)
CO2: 31 mmol/L — ABNORMAL HIGH (ref 20–29)
Calcium: 9.8 mg/dL (ref 8.6–10.2)
Chloride: 104 mmol/L (ref 96–106)
Creatinine, Ser: 0.97 mg/dL (ref 0.76–1.27)
Glucose: 158 mg/dL — ABNORMAL HIGH (ref 70–99)
Potassium: 5.3 mmol/L — ABNORMAL HIGH (ref 3.5–5.2)
Sodium: 140 mmol/L (ref 134–144)
eGFR: 89 mL/min/{1.73_m2} (ref >59)

## 2022-07-03 NOTE — Progress Notes (Signed)
Cardiology Office Note:    Date:  07/03/2022   ID:  [name], DOB 01-24-1961, MRN [id number]  PCP:  Antony Contras, MD  [facility] Cardiologist:  Sherren Mocha, MD  [facility] Electrophysiologist:  None   Chief Complaint: follow up   History of Present Illness:    [name] is a 61 y.o. male with a hx of coronary artery disease s/p prior inferior MI treated with stenting to the RCA (2005/2006), hyperlipidemia seen for follow up.    Last LHC in 2015 demonstrated a patent RCA stent. Echo 01/2018 with LVEF of 50-55% and grade 2 DD.   He stop smoking about 6 yrs about but chews nicotine gum.   Seen by me 06/13/22. EKG today shows ST/T wave inversion in inferior lead and lead V3 to V6.  Chronic T wave changes in inferior lead but new in V2-6. Mostly asymptomatic but had an chest pain episode few months prior. BB increased due to elevated blood pressure.   Follow up stress test 9/21/20223  Findings are consistent with mild inferior wall ischemia. The study is overall low risk.   A Bruce protocol stress test was performed. Exercise capacity was normal. Patient exercised for 6 min and 32 sec. Maximum HR of 146 bpm. MPHR 91.0 %. Peak METS 7.5 . The patient experienced no angina during the test. The test was stopped because the patient experienced fatigue. The patient reported fatigue during the stress test. Elevated blood pressure and normal heart rate response noted during stress. Heart rate recovery was normal.   ST depression was noted with stress. There are ST changes noted in the setting of baseline ST-T wave abnormalities. This a nonspecific finding. The ECG was not diagnostic due to resting ST-T abnormalities.   LV perfusion is abnormal. There is evidence of ischemia. There is no evidence of infarction. Defect 1: There is a medium size defect with mild reduction in uptake present in the apical to basal inferior location(s) that is partially reversible.   There is normal wall  motion in the defect area. Consistent with ischemia.   Left ventricular function is normal. Nuclear stress EF: 61 %. The left ventricular ejection fraction is normal (55-65%). End diastolic cavity size is normal. End systolic cavity size is normal.   Prior study available for comparison from 03/13/2014. Prior images reviewed. On prior study, inferior infarct is noted. On today's exam, inferior wall perfusion defect is most evident with stress, less evident on rest.  Per Dr. Burt Knack "With known hx of MI in RCA territory and prior stenting, reasonable to pursue cath. However, if he is completely asymptomatic I would treat medically in the setting of an abnormal but low risk stress test".  Here today for further discussion. He denies chest pain or dyspnea. However he has exertional fatigue. He need to stop doing to get his energy back. Yesterday he spread few bags of mulch in yard and got very tired afterwards and layed down most of the day. Takes his medications. No syncope or dizziness.   Past Medical History:  Diagnosis Date   Arthritis    Coronary artery disease    History of heart attack    seen by Dr Lia Foyer   Hyperlipidemia    Hypertension    Myocardial infarction [facility]) 2005-2006?   stent 07    Past Surgical History:  Procedure Laterality Date   bicep surgery      right bicep    CARDIAC CATHETERIZATION  2008,07  ELBOW SURGERY     left elbow    LEFT HEART CATHETERIZATION WITH CORONARY ANGIOGRAM N/A 03/29/2014   Procedure: LEFT HEART CATHETERIZATION WITH CORONARY ANGIOGRAM;  Surgeon: Blane Ohara, MD;  Location: [facility] CATH LAB;  Service: Cardiovascular;  Laterality: N/A;   MASS EXCISION  09/11/2011   Procedure: EXCISION MASS;  Surgeon: Gayland Curry, MD;  Location: [place];  Service: General;  Laterality: Left;  excision of left groin subcutaenous mass   NECK SURGERY  2009   SHOULDER ARTHROSCOPY     WRIST ARTHROSCOPY WITH DEBRIDEMENT  09/27/2012   Procedure: WRIST  ARTHROSCOPY WITH DEBRIDEMENT;  Surgeon: Wynonia Sours, MD;  Location: [place];  Service: Orthopedics;  Laterality: Left;  LEFT WRIST ARTHROSCOPY DEBRIDEMENT  SHRINKAGE TRIANGULAR FIBROCARTILAGE COMPLEX/ LIGAMENT     Current Medications: Current Meds  Medication Sig   aspirin 81 MG tablet Take 81 mg by mouth daily.    metoprolol succinate (TOPROL-XL) 50 MG 24 hr tablet Take 1 tablet (50 mg total) by mouth daily.   Multiple Vitamin (MULTIVITAMIN) tablet Take 1 tablet by mouth daily.   nitroGLYCERIN (NITROSTAT) 0.4 MG SL tablet Place 1 tablet (0.4 mg total) under the tongue every 5 (five) minutes as needed. DISSOLVE 1 TAB UNDER TONGUE EVERY 5 MINUTES AS NEEDED FOR CHEST PAIN MAY REPEAT 3 TIMES   Omega-3 Fatty Acids (FISH OIL) 1000 MG CAPS Take 3 capsules by mouth daily.    [DISCONTINUED] rosuvastatin (CRESTOR) 20 MG tablet TAKE 1 TABLET DAILY     Allergies:   Percocet [oxycodone-acetaminophen] and Oxycodone-acetaminophen   Social History   Socioeconomic History   Marital status: Married    Spouse name: Not on file   Number of children: Not on file   Years of education: Not on file   Highest education level: Not on file  Occupational History   Not on file  Tobacco Use   Smoking status: Former    Packs/day: 1.00    Types: Cigarettes    Quit date: 10/31/2015    Years since quitting: 6.6   Smokeless tobacco: Never  Vaping Use   Vaping Use: Never used  Substance and Sexual Activity   Alcohol use: No   Drug use: No   Sexual activity: Not on file  Other Topics Concern   Not on file  Social History Narrative   Not on file   Social Determinants of Health   Financial Resource Strain: Not on file  Food Insecurity: Not on file  Transportation Needs: Not on file  Physical Activity: Not on file  Stress: Not on file  Social Connections: Not on file     Family History: The patient's family history includes Aneurysm in his father; Cancer in his maternal uncle; Heart  attack in his mother; Heart disease in his mother.    ROS:   Please see the history of present illness.    All other systems reviewed and are negative.   EKGs/Labs/Other Studies Reviewed:    The following studies were reviewed today:  Echo 01/2018 Left ventricle: The cavity size was normal. Wall thickness was    normal. Systolic function was normal. The estimated ejection    fraction was in the range of 50% to 55%. Wall motion was normal;    there were no regional wall motion abnormalities. Features are    consistent with a pseudonormal left ventricular filling pattern,    with concomitant abnormal relaxation and increased filling    pressure (grade  2 diastolic dysfunction).  - Pericardium, extracardiac: A trivial pericardial effusion was    identified.    Coronary angiography: 2015 Coronary dominance: right   Left mainstem: The left main is patent. There is no obstructive disease the   Left anterior descending (LAD): The LAD is a large vessel. There is no obstructive disease noted. There is minor diffuse calcification. The diagonal branches are patent.   Left circumflex (LCx): The left circumflex is patent. The first OM branch is patent with mild 20-30% stenosis. The AV circumflex beyond the OM is small without significant disease.   Right coronary artery (RCA): The RCA is dominant. There is a long stent in the proximal vessel. The stent has 30-40% diffuse mid in-stent restenosis. There is no high-grade obstruction noted. The PDA and PLA branches are patent. The remainder of the RCA is smooth throughout its course.   Left ventriculography: There is mild hypokinesis of the inferior wall. The remaining LV wall segments are normal. The estimated LVEF is 55%.   Final Conclusions:   1. Continued patency of the RCA stent with mild diffuse in-stent restenosis 2. No significant obstructive disease the left main, LAD, left circumflex. 3. Mild contraction abnormality of the LV with  preserved overall LV systolic function.   Recommendations: Continue medical therapy and risk reduction measures.  EKG:  EKG is not ordered today.   Recent Labs: 06/19/2022: ALT 103  Recent Lipid Panel    Component Value Date/Time   CHOL 137 06/19/2022 0846   TRIG 174 (H) 06/19/2022 0846   HDL 36 (L) 06/19/2022 0846   CHOLHDL 3.8 06/19/2022 0846   CHOLHDL 3.1 07/23/2015 0734   VLDL 11 07/23/2015 0734   LDLCALC 71 06/19/2022 0846   LDLDIRECT 141.1 09/10/2009 1037   Physical Exam:    VS:  BP 124/82   Pulse 65   Ht '5\' 7"'$  (1.702 m)   Wt 186 lb (84.4 kg)   SpO2 99%   BMI 29.13 kg/m     Wt Readings from Last 3 Encounters:  07/03/22 186 lb (84.4 kg)  06/19/22 186 lb (84.4 kg)  06/09/22 186 lb (84.4 kg)     GEN:  Well nourished, well developed in no acute distress HEENT: Normal NECK: No JVD; No carotid bruits LYMPHATICS: No lymphadenopathy CARDIAC: RRR, no murmurs, rubs, gallops RESPIRATORY:  Clear to auscultation without rales, wheezing or rhonchi  ABDOMEN: Soft, non-tender, non-distended MUSCULOSKELETAL:  No edema; No deformity  SKIN: Warm and dry NEUROLOGIC:  Alert and oriented x 3 PSYCHIATRIC:  Normal affect   ASSESSMENT AND PLAN:    Unstable angina with hx of CAD - No classic chest pressure or dyspnea. He episode 2 months ago concerning for cardiac event. New EKG changes during last OV. Stress test low risk but consistent with mild inferior wall ischemia. Now having exertional limitation. Most recent episode yesterday after working in yard. We had increased BB last OV but no difference.  - Continue ASA and Toprol XL '50mg'$  qd  2. HLD 3. Transaminitis -06/19/2022: Cholesterol, Total 137; HDL 36; LDL Chol Calc (NIH) 71; Triglycerides 174  - AST/ALT increased from last year.  - Denies alcohol drinking  - Stop Crestor - Lipid clinic referral  4. HTN - BP stable    Medication Adjustments/Labs and Tests Ordered: Current medicines are reviewed at length with the  patient today.  Concerns regarding medicines are outlined above.  Orders Placed This Encounter  Procedures   CBC   Comprehensive Metabolic Panel (CMET)  AMB Referral to Advanced Lipid Disorders Clinic   No orders of the defined types were placed in this encounter.   Patient Instructions  Medication Instructions:  DISCONTINUE Crestor (Rosuvastatin) *If you need a refill on your cardiac medications before your next appointment, please call your pharmacy*   Lab Work: [facility], CMP If you have labs (blood work) drawn today and your tests are completely normal, you will receive your results only by: [place] (if you have MyChart) OR A paper copy in the mail If you have any lab test that is abnormal or we need to change your treatment, we will call you to review the results.   Testing/Procedures: Your physician has requested that you have a cardiac catheterization. Cardiac catheterization is used to diagnose and/or treat various heart conditions. Doctors may recommend this procedure for a number of different reasons. The most common reason is to evaluate chest pain. Chest pain can be a symptom of coronary artery disease (CAD), and cardiac catheterization can show whether plaque is narrowing or blocking your heart's arteries. This procedure is also used to evaluate the valves, as well as measure the blood flow and oxygen levels in different parts of your heart. For further information please visit [email or web address]. Please follow instruction sheet, as given.   Follow-Up: At [facility], you and your health needs are our priority.  As part of our continuing mission to provide you with exceptional heart care, we have created designated Provider Care Teams.  These Care Teams include your primary Cardiologist (physician) and Advanced Practice Providers (APPs -  Physician Assistants and Nurse Practitioners) who all work together to provide you with the care you need, when you  need it.  We recommend signing up for the patient portal called "MyChart".  Sign up information is provided on this After Visit Summary.  MyChart is used to connect with patients for Virtual Visits (Telemedicine).  Patients are able to view lab/test results, encounter notes, upcoming appointments, etc.  Non-urgent messages can be sent to your provider as well.   To learn more about what you can do with MyChart, go to [email or web address].    Your next appointment:   2-3 week(s)  The format for your next appointment:   In Person  Provider:   Sherren Mocha, MD  or APP   You have been referred to [place]  Other Instructions        Cardiac Catheterization   You are scheduled for a Cardiac Catheterization on Wednesday, October 11 with Dr. Sherren Mocha.  1. Please arrive at the Main Entrance A at [facility]: [place], [place] [address] on October 11 at 6:30 AM (This time is two hours before your procedure to ensure your preparation). Free valet parking service is available. You will check in at ADMITTING. The support person will be asked to wait in the waiting room.  It is OK to have someone drop you off and come back when you are ready to be discharged.        Special note: Every effort is made to have your procedure done on time. Please understand that emergencies sometimes delay scheduled procedures.   . 2. Diet: Do not eat solid foods after midnight.  You may have clear liquids until 5 AM the day of the procedure.  3. Labs: You will need to have blood drawn on Thursday, October 4 at [facility]. [address], [place]  Open:  7:30am - 5pm    Phone: [phone]. You do not need to be fasting.  4. Medication instructions in preparation for your procedure:   Contrast Allergy: No  On the morning of your procedure, take Aspirin 81 mg and any morning medicines NOT listed above.  You may use sips of  water.  5. Plan to go home the same day, you will only stay overnight if medically necessary. 6. You MUST have a responsible adult to drive you home. 7. An adult MUST be with you the first 24 hours after you arrive home. 8. Bring a current list of your medications, and the last time and date medication taken. 9. Bring ID and current insurance cards. 10.Please wear clothes that are easy to get on and off and wear slip-on shoes.  Thank you for allowing [place] to care for you!   -- [facility] services   Important Information About [place]         Jarrett Soho, PA  07/03/2022 9:16 AM    [place]

## 2022-07-03 NOTE — H&P (View-Only) (Signed)
Cardiology Office Note:    Date:  07/03/2022   ID:  [name], DOB [birth year], MRN [id number]  PCP:  Antony Contras, MD  [facility] Cardiologist:  Sherren Mocha, MD  [facility] Electrophysiologist:  None   Chief Complaint: follow up   History of Present Illness:    [name] is a 61 y.o. male with a hx of coronary artery disease s/p prior inferior MI treated with stenting to the RCA (2005/2006), hyperlipidemia seen for follow up.    Last LHC in 2015 demonstrated a patent RCA stent. Echo 01/2018 with LVEF of 50-55% and grade 2 DD.   He stop smoking about 6 yrs about but chews nicotine gum.   Seen by me 06/13/22. EKG today shows ST/T wave inversion in inferior lead and lead V3 to V6.  Chronic T wave changes in inferior lead but new in V2-6. Mostly asymptomatic but had an chest pain episode few months prior. BB increased due to elevated blood pressure.   Follow up stress test 9/21/20223  Findings are consistent with mild inferior wall ischemia. The study is overall low risk.   A Bruce protocol stress test was performed. Exercise capacity was normal. Patient exercised for 6 min and 32 sec. Maximum HR of 146 bpm. MPHR 91.0 %. Peak METS 7.5 . The patient experienced no angina during the test. The test was stopped because the patient experienced fatigue. The patient reported fatigue during the stress test. Elevated blood pressure and normal heart rate response noted during stress. Heart rate recovery was normal.   ST depression was noted with stress. There are ST changes noted in the setting of baseline ST-T wave abnormalities. This a nonspecific finding. The ECG was not diagnostic due to resting ST-T abnormalities.   LV perfusion is abnormal. There is evidence of ischemia. There is no evidence of infarction. Defect 1: There is a medium size defect with mild reduction in uptake present in the apical to basal inferior location(s) that is partially reversible.   There is normal wall  motion in the defect area. Consistent with ischemia.   Left ventricular function is normal. Nuclear stress EF: 61 %. The left ventricular ejection fraction is normal (55-65%). End diastolic cavity size is normal. End systolic cavity size is normal.   Prior study available for comparison from 03/13/2014. Prior images reviewed. On prior study, inferior infarct is noted. On today's exam, inferior wall perfusion defect is most evident with stress, less evident on rest.  Per Dr. Burt Knack "With known hx of MI in RCA territory and prior stenting, reasonable to pursue cath. However, if he is completely asymptomatic I would treat medically in the setting of an abnormal but low risk stress test".  Here today for further discussion. He denies chest pain or dyspnea. However he has exertional fatigue. He need to stop doing to get his energy back. Yesterday he spread few bags of mulch in yard and got very tired afterwards and layed down most of the day. Takes his medications. No syncope or dizziness.   Past Medical History:  Diagnosis Date   Arthritis    Coronary artery disease    History of heart attack    seen by Dr Lia Foyer   Hyperlipidemia    Hypertension    Myocardial infarction [facility]) 2005-2006?   stent 07    Past Surgical History:  Procedure Laterality Date   bicep surgery      right bicep    CARDIAC CATHETERIZATION  2008,07  ELBOW SURGERY     left elbow    LEFT HEART CATHETERIZATION WITH CORONARY ANGIOGRAM N/A 03/29/2014   Procedure: LEFT HEART CATHETERIZATION WITH CORONARY ANGIOGRAM;  Surgeon: Blane Ohara, MD;  Location: [facility] CATH LAB;  Service: Cardiovascular;  Laterality: N/A;   MASS EXCISION  09/11/2011   Procedure: EXCISION MASS;  Surgeon: Gayland Curry, MD;  Location: [place];  Service: General;  Laterality: Left;  excision of left groin subcutaenous mass   NECK SURGERY  2009   SHOULDER ARTHROSCOPY     WRIST ARTHROSCOPY WITH DEBRIDEMENT  09/27/2012   Procedure: WRIST  ARTHROSCOPY WITH DEBRIDEMENT;  Surgeon: Wynonia Sours, MD;  Location: [place];  Service: Orthopedics;  Laterality: Left;  LEFT WRIST ARTHROSCOPY DEBRIDEMENT  SHRINKAGE TRIANGULAR FIBROCARTILAGE COMPLEX/ LIGAMENT     Current Medications: Current Meds  Medication Sig   aspirin 81 MG tablet Take 81 mg by mouth daily.    metoprolol succinate (TOPROL-XL) 50 MG 24 hr tablet Take 1 tablet (50 mg total) by mouth daily.   Multiple Vitamin (MULTIVITAMIN) tablet Take 1 tablet by mouth daily.   nitroGLYCERIN (NITROSTAT) 0.4 MG SL tablet Place 1 tablet (0.4 mg total) under the tongue every 5 (five) minutes as needed. DISSOLVE 1 TAB UNDER TONGUE EVERY 5 MINUTES AS NEEDED FOR CHEST PAIN MAY REPEAT 3 TIMES   Omega-3 Fatty Acids (FISH OIL) 1000 MG CAPS Take 3 capsules by mouth daily.    [DISCONTINUED] rosuvastatin (CRESTOR) 20 MG tablet TAKE 1 TABLET DAILY     Allergies:   Percocet [oxycodone-acetaminophen] and Oxycodone-acetaminophen   Social History   Socioeconomic History   Marital status: Married    Spouse name: Not on file   Number of children: Not on file   Years of education: Not on file   Highest education level: Not on file  Occupational History   Not on file  Tobacco Use   Smoking status: Former    Packs/day: 1.00    Types: Cigarettes    Quit date: 10/31/2015    Years since quitting: 6.6   Smokeless tobacco: Never  Vaping Use   Vaping Use: Never used  Substance and Sexual Activity   Alcohol use: No   Drug use: No   Sexual activity: Not on file  Other Topics Concern   Not on file  Social History Narrative   Not on file   Social Determinants of Health   Financial Resource Strain: Not on file  Food Insecurity: Not on file  Transportation Needs: Not on file  Physical Activity: Not on file  Stress: Not on file  Social Connections: Not on file     Family History: The patient's family history includes Aneurysm in his father; Cancer in his maternal uncle; Heart  attack in his mother; Heart disease in his mother.    ROS:   Please see the history of present illness.    All other systems reviewed and are negative.   EKGs/Labs/Other Studies Reviewed:    The following studies were reviewed today:  Echo 01/2018 Left ventricle: The cavity size was normal. Wall thickness was    normal. Systolic function was normal. The estimated ejection    fraction was in the range of 50% to 55%. Wall motion was normal;    there were no regional wall motion abnormalities. Features are    consistent with a pseudonormal left ventricular filling pattern,    with concomitant abnormal relaxation and increased filling    pressure (grade  2 diastolic dysfunction).  - Pericardium, extracardiac: A trivial pericardial effusion was    identified.    Coronary angiography: 2015 Coronary dominance: right   Left mainstem: The left main is patent. There is no obstructive disease the   Left anterior descending (LAD): The LAD is a large vessel. There is no obstructive disease noted. There is minor diffuse calcification. The diagonal branches are patent.   Left circumflex (LCx): The left circumflex is patent. The first OM branch is patent with mild 20-30% stenosis. The AV circumflex beyond the OM is small without significant disease.   Right coronary artery (RCA): The RCA is dominant. There is a long stent in the proximal vessel. The stent has 30-40% diffuse mid in-stent restenosis. There is no high-grade obstruction noted. The PDA and PLA branches are patent. The remainder of the RCA is smooth throughout its course.   Left ventriculography: There is mild hypokinesis of the inferior wall. The remaining LV wall segments are normal. The estimated LVEF is 55%.   Final Conclusions:   1. Continued patency of the RCA stent with mild diffuse in-stent restenosis 2. No significant obstructive disease the left main, LAD, left circumflex. 3. Mild contraction abnormality of the LV with  preserved overall LV systolic function.   Recommendations: Continue medical therapy and risk reduction measures.  EKG:  EKG is not ordered today.   Recent Labs: 06/19/2022: ALT 103  Recent Lipid Panel    Component Value Date/Time   CHOL 137 06/19/2022 0846   TRIG 174 (H) 06/19/2022 0846   HDL 36 (L) 06/19/2022 0846   CHOLHDL 3.8 06/19/2022 0846   CHOLHDL 3.1 07/23/2015 0734   VLDL 11 07/23/2015 0734   LDLCALC 71 06/19/2022 0846   LDLDIRECT 141.1 09/10/2009 1037   Physical Exam:    VS:  BP 124/82   Pulse 65   Ht '5\' 7"'$  (1.702 m)   Wt 186 lb (84.4 kg)   SpO2 99%   BMI 29.13 kg/m     Wt Readings from Last 3 Encounters:  07/03/22 186 lb (84.4 kg)  06/19/22 186 lb (84.4 kg)  06/09/22 186 lb (84.4 kg)     GEN:  Well nourished, well developed in no acute distress HEENT: Normal NECK: No JVD; No carotid bruits LYMPHATICS: No lymphadenopathy CARDIAC: RRR, no murmurs, rubs, gallops RESPIRATORY:  Clear to auscultation without rales, wheezing or rhonchi  ABDOMEN: Soft, non-tender, non-distended MUSCULOSKELETAL:  No edema; No deformity  SKIN: Warm and dry NEUROLOGIC:  Alert and oriented x 3 PSYCHIATRIC:  Normal affect   ASSESSMENT AND PLAN:    Unstable angina with hx of CAD - No classic chest pressure or dyspnea. He episode 2 months ago concerning for cardiac event. New EKG changes during last OV. Stress test low risk but consistent with mild inferior wall ischemia. Now having exertional limitation. Most recent episode yesterday after working in yard. We had increased BB last OV but no difference.  - Continue ASA and Toprol XL '50mg'$  qd  2. HLD 3. Transaminitis -06/19/2022: Cholesterol, Total 137; HDL 36; LDL Chol Calc (NIH) 71; Triglycerides 174  - AST/ALT increased from last year.  - Denies alcohol drinking  - Stop Crestor - Lipid clinic referral  4. HTN - BP stable    Medication Adjustments/Labs and Tests Ordered: Current medicines are reviewed at length with the  patient today.  Concerns regarding medicines are outlined above.  Orders Placed This Encounter  Procedures   CBC   Comprehensive Metabolic Panel (CMET)  AMB Referral to Advanced Lipid Disorders Clinic   No orders of the defined types were placed in this encounter.   Patient Instructions  Medication Instructions:  DISCONTINUE Crestor (Rosuvastatin) *If you need a refill on your cardiac medications before your next appointment, please call your pharmacy*   Lab Work: [facility], CMP If you have labs (blood work) drawn today and your tests are completely normal, you will receive your results only by: [place] (if you have MyChart) OR A paper copy in the mail If you have any lab test that is abnormal or we need to change your treatment, we will call you to review the results.   Testing/Procedures: Your physician has requested that you have a cardiac catheterization. Cardiac catheterization is used to diagnose and/or treat various heart conditions. Doctors may recommend this procedure for a number of different reasons. The most common reason is to evaluate chest pain. Chest pain can be a symptom of coronary artery disease (CAD), and cardiac catheterization can show whether plaque is narrowing or blocking your heart's arteries. This procedure is also used to evaluate the valves, as well as measure the blood flow and oxygen levels in different parts of your heart. For further information please visit [email or web address].[email or web address]. Please follow instruction sheet, as given.   Follow-Up: At [facility], you and your health needs are our priority.  As part of our continuing mission to provide you with exceptional heart care, we have created designated Provider Care Teams.  These Care Teams include your primary Cardiologist (physician) and Advanced Practice Providers (APPs -  Physician Assistants and Nurse Practitioners) who all work together to provide you with the care you need, when you  need it.  We recommend signing up for the patient portal called "MyChart".  Sign up information is provided on this After Visit Summary.  MyChart is used to connect with patients for Virtual Visits (Telemedicine).  Patients are able to view lab/test results, encounter notes, upcoming appointments, etc.  Non-urgent messages can be sent to your provider as well.   To learn more about what you can do with MyChart, go to [email or web address].    Your next appointment:   2-3 week(s)  The format for your next appointment:   In Person  Provider:   Sherren Mocha, MD  or APP   You have been referred to [place]  Other Instructions        Cardiac Catheterization   You are scheduled for a Cardiac Catheterization on Wednesday, October 11 with Dr. Sherren Mocha.  1. Please arrive at the Main Entrance A at [facility]: [place], [place] [address] on October 11 at 6:30 AM (This time is two hours before your procedure to ensure your preparation). Free valet parking service is available. You will check in at ADMITTING. The support person will be asked to wait in the waiting room.  It is OK to have someone drop you off and come back when you are ready to be discharged.        Special note: Every effort is made to have your procedure done on time. Please understand that emergencies sometimes delay scheduled procedures.   . 2. Diet: Do not eat solid foods after midnight.  You may have clear liquids until 5 AM the day of the procedure.  3. Labs: You will need to have blood drawn on Thursday, October 4 at [facility] at [facility]. [address], [place]  Open:  7:30am - 5pm    Phone: [phone]. You do not need to be fasting.  4. Medication instructions in preparation for your procedure:   Contrast Allergy: No  On the morning of your procedure, take Aspirin 81 mg and any morning medicines NOT listed above.  You may use sips of  water.  5. Plan to go home the same day, you will only stay overnight if medically necessary. 6. You MUST have a responsible adult to drive you home. 7. An adult MUST be with you the first 24 hours after you arrive home. 8. Bring a current list of your medications, and the last time and date medication taken. 9. Bring ID and current insurance cards. 10.Please wear clothes that are easy to get on and off and wear slip-on shoes.  Thank you for allowing [place] to care for you!   -- [facility] Invasive Cardiovascular services   Important Information About Sugar         Jarrett Soho, PA  07/03/2022 9:16 AM    [name]

## 2022-07-03 NOTE — Patient Instructions (Addendum)
Medication Instructions:  DISCONTINUE Crestor (Rosuvastatin) *If you need a refill on your cardiac medications before your next appointment, please call your pharmacy*   Lab Work: [facility], BMET If you have labs (blood work) drawn today and your tests are completely normal, you will receive your results only by: [place] (if you have MyChart) OR A paper copy in the mail If you have any lab test that is abnormal or we need to change your treatment, we will call you to review the results.   Testing/Procedures: Your physician has requested that you have a cardiac catheterization. Cardiac catheterization is used to diagnose and/or treat various heart conditions. Doctors may recommend this procedure for a number of different reasons. The most common reason is to evaluate chest pain. Chest pain can be a symptom of coronary artery disease (CAD), and cardiac catheterization can show whether plaque is narrowing or blocking your heart's arteries. This procedure is also used to evaluate the valves, as well as measure the blood flow and oxygen levels in different parts of your heart. For further information please visit [email or web address]. Please follow instruction sheet, as given.   Follow-Up: At [facility], you and your health needs are our priority.  As part of our continuing mission to provide you with exceptional heart care, we have created designated Provider Care Teams.  These Care Teams include your primary Cardiologist (physician) and Advanced Practice Providers (APPs -  Physician Assistants and Nurse Practitioners) who all work together to provide you with the care you need, when you need it.  We recommend signing up for the patient portal called "MyChart".  Sign up information is provided on this After Visit Summary.  MyChart is used to connect with patients for Virtual Visits (Telemedicine).  Patients are able to view lab/test results, encounter notes, upcoming appointments,  etc.  Non-urgent messages can be sent to your provider as well.   To learn more about what you can do with MyChart, go to [email or web address].    Your next appointment:   2-3 week(s)  The format for your next appointment:   In Person  Provider:   Sherren Mocha, MD  or APP   You have been referred to [place]  Other Instructions        Cardiac Catheterization   You are scheduled for a Cardiac Catheterization on Wednesday, October 11 with Dr. Sherren Mocha.  1. Please arrive at the Main Entrance A at [facility]: [place], [place] [address] on October 11 at 6:30 AM (This time is two hours before your procedure to ensure your preparation). Free valet parking service is available. You will check in at ADMITTING. The support person will be asked to wait in the waiting room.  It is OK to have someone drop you off and come back when you are ready to be discharged.        Special note: Every effort is made to have your procedure done on time. Please understand that emergencies sometimes delay scheduled procedures.   . 2. Diet: Do not eat solid foods after midnight.  You may have clear liquids until 5 AM the day of the procedure.  3. Labs: You will need to have blood drawn on Thursday, October 4 at [facility] at [facility]. [address]  Open: 7:30am - 5pm    Phone: [phone]. You do not need to be fasting.  4. Medication instructions in preparation for your procedure:  Contrast Allergy: No  On the morning of your procedure, take Aspirin 81 mg and any morning medicines NOT listed above.  You may use sips of water.  5. Plan to go home the same day, you will only stay overnight if medically necessary. 6. You MUST have a responsible adult to drive you home. 7. An adult MUST be with you the first 24 hours after you arrive home. 8. Bring a current list of your medications, and the last time and date medication  taken. 9. Bring ID and current insurance cards. 10.Please wear clothes that are easy to get on and off and wear slip-on shoes.  Thank you for allowing [place] to care for you!   -- [place] Invasive Cardiovascular services   Important Information About Sugar

## 2022-07-04 ENCOUNTER — Telehealth: Payer: Self-pay

## 2022-07-04 NOTE — Telephone Encounter (Signed)
Reviewing labs in triage and this pt's K+ from yesterday was elevated at 5.3. Prior labs for comparison done 1 year ago. Pt is not on any medications that would lend to hyperkalemia. Reviewed dietary sources of potassium with him and he condones eating "a lot of tomato sandwiches and I eat beans usually daily." Advised pt to cut down on these, and other foods high in potassium, for the next few days and it should correct itself. Will route to [place] for review.

## 2022-07-08 ENCOUNTER — Telehealth: Payer: Self-pay | Admitting: *Deleted

## 2022-07-08 NOTE — Telephone Encounter (Signed)
Cardiac Catheterization scheduled at [facility] for: Wednesday July 09, 2022 8:30 AM Arrival time and place: [place] Entrance A at: 6:30 AM  Nothing to eat after midnight prior to procedure, clear liquids until 5 AM day of procedure.  Medication instructions: -Usual morning medications can be taken with sips of water including aspirin 81 mg.  Confirmed patient has responsible adult to drive home post procedure and be with patient first 24 hours after arriving home.  Patient reports no new symptoms concerning for COVID-19 in the past 10 days.  Reviewed procedure instructions with patient.

## 2022-07-09 ENCOUNTER — Encounter (HOSPITAL_COMMUNITY)
Admission: RE | Disposition: A | Discharge status: Home / Self Care | Payer: Self-pay | Source: Home / Self Care | Attending: Cardiovascular Disease

## 2022-07-09 ENCOUNTER — Encounter (HOSPITAL_COMMUNITY): Payer: Self-pay | Admitting: Cardiovascular Disease

## 2022-07-09 ENCOUNTER — Ambulatory Visit (HOSPITAL_COMMUNITY)
Admission: RE | Admit: 2022-07-09 | Discharge: 2022-07-09 | Disposition: A | Discharge status: Home / Self Care | Payer: Managed Care, Other (non HMO) | Attending: Cardiovascular Disease | Admitting: Cardiovascular Disease

## 2022-07-09 ENCOUNTER — Other Ambulatory Visit: Payer: Self-pay

## 2022-07-09 DIAGNOSIS — I10 Essential (primary) hypertension: Secondary | ICD-10-CM | POA: Diagnosis not present

## 2022-07-09 DIAGNOSIS — Z7982 Long term (current) use of aspirin: Secondary | ICD-10-CM | POA: Insufficient documentation

## 2022-07-09 DIAGNOSIS — Z87891 Personal history of nicotine dependence: Secondary | ICD-10-CM | POA: Insufficient documentation

## 2022-07-09 DIAGNOSIS — R931 Abnormal findings on diagnostic imaging of heart and coronary circulation: Secondary | ICD-10-CM | POA: Diagnosis not present

## 2022-07-09 DIAGNOSIS — I252 Old myocardial infarction: Secondary | ICD-10-CM | POA: Insufficient documentation

## 2022-07-09 DIAGNOSIS — Y832 Surgical operation with anastomosis, bypass or graft as the cause of abnormal reaction of the patient, or of later complication, without mention of misadventure at the time of the procedure: Secondary | ICD-10-CM | POA: Insufficient documentation

## 2022-07-09 DIAGNOSIS — E785 Hyperlipidemia, unspecified: Secondary | ICD-10-CM | POA: Diagnosis not present

## 2022-07-09 DIAGNOSIS — I25110 Atherosclerotic heart disease of native coronary artery with unstable angina pectoris: Secondary | ICD-10-CM | POA: Diagnosis not present

## 2022-07-09 DIAGNOSIS — Z79899 Other long term (current) drug therapy: Secondary | ICD-10-CM | POA: Diagnosis not present

## 2022-07-09 DIAGNOSIS — I2510 Atherosclerotic heart disease of native coronary artery without angina pectoris: Secondary | ICD-10-CM

## 2022-07-09 DIAGNOSIS — T82855A Stenosis of coronary artery stent, initial encounter: Secondary | ICD-10-CM | POA: Diagnosis not present

## 2022-07-09 DIAGNOSIS — Z955 Presence of coronary angioplasty implant and graft: Secondary | ICD-10-CM | POA: Diagnosis not present

## 2022-07-09 HISTORY — PX: INTRAVASCULAR PRESSURE WIRE/FFR STUDY: CATH118243

## 2022-07-09 HISTORY — PX: LEFT HEART CATH AND CORONARY ANGIOGRAPHY: CATH118249

## 2022-07-09 LAB — POCT ACTIVATED CLOTTING TIME: Activated Clotting Time: 275 seconds

## 2022-07-09 SURGERY — LEFT HEART CATH AND CORONARY ANGIOGRAPHY
Anesthesia: LOCAL

## 2022-07-09 MED ORDER — NITROGLYCERIN 1 MG/10 ML FOR IR/CATH LAB
INTRA_ARTERIAL | Status: AC
  Filled 2022-07-09: qty 10

## 2022-07-09 MED ORDER — MIDAZOLAM HCL 2 MG/2ML IJ SOLN
INTRAMUSCULAR | Status: DC | PRN
  Administered 2022-07-09: 2 mg via INTRAVENOUS

## 2022-07-09 MED ORDER — LIDOCAINE HCL (PF) 1 % IJ SOLN
INTRAMUSCULAR | Status: AC
  Filled 2022-07-09: qty 30

## 2022-07-09 MED ORDER — ONDANSETRON HCL 4 MG/2ML IJ SOLN: 4.0000 mg | Freq: Four times a day (QID) | INTRAMUSCULAR | Status: DC | PRN

## 2022-07-09 MED ORDER — SODIUM CHLORIDE 0.9 % IV SOLN: 250.0000 mL | INTRAVENOUS | Status: DC | PRN

## 2022-07-09 MED ORDER — MIDAZOLAM HCL 2 MG/2ML IJ SOLN
INTRAMUSCULAR | Status: AC
  Filled 2022-07-09: qty 2

## 2022-07-09 MED ORDER — SODIUM CHLORIDE 0.9% FLUSH: 3.0000 mL | Freq: Two times a day (BID) | INTRAVENOUS | Status: DC

## 2022-07-09 MED ORDER — HEPARIN SODIUM (PORCINE) 1000 UNIT/ML IJ SOLN
INTRAMUSCULAR | Status: AC
  Filled 2022-07-09: qty 10

## 2022-07-09 MED ORDER — NITROGLYCERIN 1 MG/10 ML FOR IR/CATH LAB
INTRA_ARTERIAL | Status: DC | PRN
  Administered 2022-07-09: 100 ug via INTRACORONARY

## 2022-07-09 MED ORDER — FENTANYL CITRATE (PF) 100 MCG/2ML IJ SOLN
INTRAMUSCULAR | Status: AC
  Filled 2022-07-09: qty 2

## 2022-07-09 MED ORDER — VERAPAMIL HCL 2.5 MG/ML IV SOLN
INTRAVENOUS | Status: AC
  Filled 2022-07-09: qty 2

## 2022-07-09 MED ORDER — DIAZEPAM 5 MG PO TABS
5.0000 mg | ORAL_TABLET | Freq: Once | ORAL | Status: AC
  Administered 2022-07-09: 5 mg via ORAL
  Filled 2022-07-09: qty 1

## 2022-07-09 MED ORDER — HYDRALAZINE HCL 20 MG/ML IJ SOLN: 10.0000 mg | INTRAMUSCULAR | Status: DC | PRN

## 2022-07-09 MED ORDER — SODIUM CHLORIDE 0.9% FLUSH: 3.0000 mL | INTRAVENOUS | Status: DC | PRN

## 2022-07-09 MED ORDER — HEPARIN (PORCINE) IN NACL 1000-0.9 UT/500ML-% IV SOLN
INTRAVENOUS | Status: DC | PRN
  Administered 2022-07-09 (×2): 500 mL

## 2022-07-09 MED ORDER — LABETALOL HCL 5 MG/ML IV SOLN: 10.0000 mg | INTRAVENOUS | Status: DC | PRN

## 2022-07-09 MED ORDER — SODIUM CHLORIDE (PF) 0.9 % IJ SOLN
INTRAMUSCULAR | Status: DC | PRN
  Administered 2022-07-09: 250 mL via INTRAVENOUS

## 2022-07-09 MED ORDER — SODIUM CHLORIDE 0.9 % WEIGHT BASED INFUSION
3.0000 mL/kg/h | INTRAVENOUS | Status: AC
  Administered 2022-07-09: 3 mL/kg/h via INTRAVENOUS

## 2022-07-09 MED ORDER — SODIUM CHLORIDE 0.9 % WEIGHT BASED INFUSION: 1.0000 mL/kg/h | INTRAVENOUS | Status: DC

## 2022-07-09 MED ORDER — FENTANYL CITRATE (PF) 100 MCG/2ML IJ SOLN
INTRAMUSCULAR | Status: DC | PRN
  Administered 2022-07-09: 25 ug via INTRAVENOUS

## 2022-07-09 MED ORDER — IOHEXOL 350 MG/ML SOLN
INTRAVENOUS | Status: DC | PRN
  Administered 2022-07-09: 60 mL

## 2022-07-09 MED ORDER — LIDOCAINE HCL (PF) 1 % IJ SOLN
INTRAMUSCULAR | Status: DC | PRN
  Administered 2022-07-09: 2 mL

## 2022-07-09 MED ORDER — VERAPAMIL HCL 2.5 MG/ML IV SOLN
INTRAVENOUS | Status: DC | PRN
  Administered 2022-07-09: 10 mL via INTRA_ARTERIAL

## 2022-07-09 MED ORDER — ASPIRIN 81 MG PO CHEW: 81.0000 mg | CHEWABLE_TABLET | ORAL | Status: DC

## 2022-07-09 MED ORDER — HEPARIN SODIUM (PORCINE) 1000 UNIT/ML IJ SOLN
INTRAMUSCULAR | Status: DC | PRN
  Administered 2022-07-09 (×2): 4000 [IU] via INTRAVENOUS

## 2022-07-09 SURGICAL SUPPLY — 14 items
BAND ZEPHYR COMPRESS 30 LONG (HEMOSTASIS) IMPLANT
CATH 5FR JL3.5 JR4 ANG PIG MP (CATHETERS) IMPLANT
CATH LAUNCHER 5F JR4 (CATHETERS) IMPLANT
GLIDESHEATH SLEND SS 6F .021 (SHEATH) IMPLANT
GUIDEWIRE INQWIRE 1.5J.035X260 (WIRE) IMPLANT
GUIDEWIRE PRESSURE X 175 (WIRE) IMPLANT
INQWIRE 1.5J .035X260CM (WIRE) ×1
KIT ENCORE 26 ADVANTAGE (KITS) IMPLANT
KIT HEART LEFT (KITS) ×1 IMPLANT
KIT HEMO VALVE WATCHDOG (MISCELLANEOUS) IMPLANT
PACK CARDIAC CATHETERIZATION (CUSTOM PROCEDURE TRAY) ×1 IMPLANT
SYR MEDRAD MARK 7 150ML (SYRINGE) ×1 IMPLANT
TRANSDUCER W/STOPCOCK (MISCELLANEOUS) ×1 IMPLANT
TUBING CIL FLEX 10 FLL-RA (TUBING) ×1 IMPLANT

## 2022-07-09 NOTE — Discharge Instructions (Signed)

## 2022-07-09 NOTE — Progress Notes (Signed)
TR BAND REMOVAL  LOCATION:    right radial  DEFLATED PER PROTOCOL:    Yes.    TIME BAND OFF / DRESSING APPLIED:    1120 gauze dressing applied   SITE UPON ARRIVAL:    Level 0  SITE AFTER BAND REMOVAL:    Level 0  CIRCULATION SENSATION AND MOVEMENT:    Within Normal Limits   Yes.    COMMENTS:   no issues noted

## 2022-07-09 NOTE — Interval H&P Note (Signed)
Cath Lab Visit (complete for each Cath Lab visit)  Clinical Evaluation Leading to the Procedure:   ACS: No.  Non-ACS:    Anginal Classification: CCS II  Anti-ischemic medical therapy: Minimal Therapy (1 class of medications)  Non-Invasive Test Results: Low-risk stress test findings: cardiac mortality <1%/year  Prior CABG: No previous CABG      History and Physical Interval Note:  07/09/2022 8:35 AM  [name]  has presented today for surgery, with the diagnosis of unstable angina with CAD.  The various methods of treatment have been discussed with the patient and family. After consideration of risks, benefits and other options for treatment, the patient has consented to  Procedure(s): LEFT HEART CATH AND CORONARY ANGIOGRAPHY (N/A) as a surgical intervention.  The patient's history has been reviewed, patient examined, no change in status, stable for surgery.  I have reviewed the patient's chart and labs.  Questions were answered to the patient's satisfaction.     Sherren Mocha

## 2022-07-21 ENCOUNTER — Ambulatory Visit: Payer: Managed Care, Other (non HMO) | Attending: Physician Assistant | Admitting: Physician Assistant

## 2022-07-21 ENCOUNTER — Encounter: Payer: Self-pay | Admitting: Physician Assistant

## 2022-07-21 VITALS — BP 130/72 | HR 75 | Ht 67.0 in | Wt 186.0 lb

## 2022-07-21 DIAGNOSIS — E785 Hyperlipidemia, unspecified: Secondary | ICD-10-CM

## 2022-07-21 DIAGNOSIS — I10 Essential (primary) hypertension: Secondary | ICD-10-CM | POA: Diagnosis not present

## 2022-07-21 DIAGNOSIS — I25750 Atherosclerosis of native coronary artery of transplanted heart with unstable angina: Secondary | ICD-10-CM | POA: Diagnosis not present

## 2022-07-21 NOTE — Patient Instructions (Signed)
Medication Instructions:  Your physician recommends that you continue on your current medications as directed. Please refer to the Current Medication list given to you today.  *If you need a refill on your cardiac medications before your next appointment, please call your pharmacy*   Lab Work: None If you have labs (blood work) drawn today and your tests are completely normal, you will receive your results only by: [place] (if you have MyChart) OR A paper copy in the mail If you have any lab test that is abnormal or we need to change your treatment, we will call you to review the results.   Follow-Up: At [facility], you and your health needs are our priority.  As part of our continuing mission to provide you with exceptional heart care, we have created designated Provider Care Teams.  These Care Teams include your primary Cardiologist (physician) and Advanced Practice Providers (APPs -  Physician Assistants and Nurse Practitioners) who all work together to provide you with the care you need, when you need it.   Your next appointment:   3-4 month(s)  The format for your next appointment:   In Person  Provider:   Sherren Mocha, MD    Other Instructions Try to aggressively work on your diet and lifestyle as discussed at today's office visit: 1.Cut back on Mtn Dew 2.Decrease nicorette gum 3.Decrease intake of red meat 4.Your provider recommends that you get 150 minutes per week of moderate aerobic activity. Aim for 30 minutes a day for 5 days of the week.  Important Information About Sugar

## 2022-07-21 NOTE — Progress Notes (Signed)
Office Visit    Patient Name: [name] Date of Encounter: 07/21/2022  PCP:  Antony Contras, MD   [facility]  Cardiologist:  Sherren Mocha, MD  Advanced Practice Provider:  No care team member to display Electrophysiologist:  None   HPI    [name] is a 61 y.o. male with a history of coronary artery disease status post prior inferior MI treated with stenting to the RCA (2000 01/2005), hyperlipidemia presents today for postcardiac catheterization follow-up.  Last cardiac catheterization in 2015 demonstrated patent RCA stent.  Echo 01/2018 with LVEF of 50 to 55% and grade 2 DD.  He stopped smoking about 6 years ago but continues to chew Nicorette gum (120 pieces every 2 weeks).  He was seen by Dr. Burt Knack 06/13/2022 and his EKG showed ST/T wave inversion in the inferior lead and lead V3 through V6.  Chronic T wave changes in inferior lead but new V2 to V6.  Mostly asymptomatic but had chest pain episode few months prior.  Beta-blocker increased due to elevated blood pressure.  He was then seen again by Dr. Burt Knack 07/03/2022 and had spread back of mulch in the yard and got very tired afterwards and lay down most of the day.  He was compliant with his medications.  No syncope or dizziness.  Due to his symptoms and EKG a cardiac catheterization was ordered.   Today, he is doing well.  No further chest pain, shortness of breath, but still does endorse some fatigue after doing a lot of activity.  He remains compliant on his medications.  We discussed a few lifestyle changes.  He quit smoking 6 years ago but has been chewing a lot of Nicorette gum.  He also admits to eating a lot of fast food especially burgers.  His triglycerides remain elevated above goal at 174.  We also discussed incorporating an exercise program into his routine for further cardiac risk reduction.  He has extreme itchiness on the outside of his ears.  This kept him up all night.  I said he could  try some over-the-counter cortisone cream and recommended he follow-up with his primary.  Reports no chest pain, pressure, or tightness. No edema, orthopnea, PND. Reports no palpitations.    Past Medical History    Past Medical History:  Diagnosis Date   Arthritis    Coronary artery disease    History of heart attack    seen by Dr Lia Foyer   Hyperlipidemia    Hypertension    Myocardial infarction [facility]) 2005-2006?   stent 07   Past Surgical History:  Procedure Laterality Date   bicep surgery      right bicep    CARDIAC CATHETERIZATION  2008,07   ELBOW SURGERY     left elbow    INTRAVASCULAR PRESSURE WIRE/FFR STUDY N/A 07/09/2022   Procedure: INTRAVASCULAR PRESSURE WIRE/FFR STUDY;  Surgeon: Sherren Mocha, MD;  Location: [place] [facility];  Service: Cardiovascular;  Laterality: N/A;   LEFT HEART CATH AND CORONARY ANGIOGRAPHY N/A 07/09/2022   Procedure: LEFT HEART CATH AND CORONARY ANGIOGRAPHY;  Surgeon: Sherren Mocha, MD;  Location: [place] [facility];  Service: Cardiovascular;  Laterality: N/A;   LEFT HEART CATHETERIZATION WITH CORONARY ANGIOGRAM N/A 03/29/2014   Procedure: LEFT HEART CATHETERIZATION WITH CORONARY ANGIOGRAM;  Surgeon: Blane Ohara, MD;  Location: [facility] CATH LAB;  Service: Cardiovascular;  Laterality: N/A;   MASS EXCISION  09/11/2011   Procedure: EXCISION MASS;  Surgeon: Randall Hiss  Ronnie Derby, MD;  Location: [place];  Service: General;  Laterality: Left;  excision of left groin subcutaenous mass   NECK SURGERY  2009   SHOULDER ARTHROSCOPY     WRIST ARTHROSCOPY WITH DEBRIDEMENT  09/27/2012   Procedure: WRIST ARTHROSCOPY WITH DEBRIDEMENT;  Surgeon: Wynonia Sours, MD;  Location: [place];  Service: Orthopedics;  Laterality: Left;  LEFT WRIST ARTHROSCOPY DEBRIDEMENT  SHRINKAGE TRIANGULAR FIBROCARTILAGE COMPLEX/ LIGAMENT     Allergies  Allergies  Allergen Reactions   Percocet [Oxycodone-Acetaminophen] Nausea And Vomiting    Oxycodone-Acetaminophen Nausea And Vomiting     EKGs/Labs/Other Studies Reviewed:   The following studies were reviewed today:  Cardiac catheterization 07/09/2022  Left Anterior Descending  There is mild diffuse disease throughout the vessel. The LAD reaches the apex of the heart. The vessel has mild irregularity but no significant stenosis throughout. The vessel wraps the LV apex. The diagonal branches are small in caliber with no significant stenosis. The septal perforating branches have no significant stenoses.    Left Circumflex  The vessel exhibits minimal luminal irregularities.  Prox Cx to Mid Cx lesion is 40% stenosed.    First Obtuse Marginal Branch  The circumflex has minimal irregularity. The vessel supplies a single obtuse marginal branch with no significant stenosis. There is a mild 30 to 40% stenosis in the midportion of the circumflex.    Right Coronary Artery  There is mild diffuse disease throughout the vessel.  Prox RCA lesion is 50% stenosed. The lesion was previously treated over 2 years ago.    Intervention   No interventions have been documented.   Wall Motion              Left Heart  Left Ventricle The left ventricular size is normal. The left ventricular systolic function is normal. LV end diastolic pressure is normal. The left ventricular ejection fraction is 50-55% by visual estimate. There are LV function abnormalities due to segmental dysfunction.   Coronary Diagrams   Diagnostic Dominance: Right    Intervention    EKG:  EKG is not ordered today.  Recent Labs: 06/19/2022: ALT 103 07/03/2022: BUN 15; Creatinine, Ser 0.97; Hemoglobin 14.7; Platelets 174; Potassium 5.3; Sodium 140  Recent Lipid Panel    Component Value Date/Time   CHOL 137 06/19/2022 0846   TRIG 174 (H) 06/19/2022 0846   HDL 36 (L) 06/19/2022 0846   CHOLHDL 3.8 06/19/2022 0846   CHOLHDL 3.1 07/23/2015 0734   VLDL 11 07/23/2015 0734   LDLCALC 71 06/19/2022 0846    LDLDIRECT 141.1 09/10/2009 1037    Home Medications   Current Meds  Medication Sig   albuterol (VENTOLIN HFA) 108 (90 Base) MCG/ACT inhaler Inhale 2 puffs into the lungs every 6 (six) hours as needed for wheezing (Congestion).   aspirin 81 MG tablet Take 81 mg by mouth daily.    metoprolol succinate (TOPROL-XL) 50 MG 24 hr tablet TAKE 1 TABLET BY MOUTH EVERY DAY   Multiple Vitamin (MULTIVITAMIN) tablet Take 1 tablet by mouth daily.   nitroGLYCERIN (NITROSTAT) 0.4 MG SL tablet Place 1 tablet (0.4 mg total) under the tongue every 5 (five) minutes as needed. DISSOLVE 1 TAB UNDER TONGUE EVERY 5 MINUTES AS NEEDED FOR CHEST PAIN MAY REPEAT 3 TIMES   Omega-3 Fatty Acids (FISH OIL) 1000 MG CAPS Take 3 capsules by mouth daily.      Review of Systems      All other systems reviewed and are otherwise negative  except as noted above.  Physical Exam    VS:  BP 130/72   Pulse 75   Ht '5\' 7"'$  (1.702 m)   Wt 186 lb (84.4 kg)   SpO2 97%   BMI 29.13 kg/m  , BMI Body mass index is 29.13 kg/m.  Wt Readings from Last 3 Encounters:  07/21/22 186 lb (84.4 kg)  07/09/22 183 lb (83 kg)  07/03/22 186 lb (84.4 kg)     GEN: Well nourished, well developed, in no acute distress. HEENT: normal. Neck: Supple, no JVD, carotid bruits, or masses. Cardiac: RRR, no murmurs, rubs, or gallops. No clubbing, cyanosis, edema.  Radials/PT 2+ and equal bilaterally.  Respiratory:  Respirations regular and unlabored, clear to auscultation bilaterally. GI: Soft, nontender, nondistended. MS: No deformity or atrophy. Skin: Warm and dry, no rash. Neuro:  Strength and sensation are intact. Psych: Normal affect.  Assessment & Plan    CAD -Recent cardiac catheterization with 50% in-stent RCA stenosis, 40% circumflex stenosis -Continue GDMT: Aspirin 81 mg daily, metoprolol succinate 50 mg daily, omega-3 fatty acids 3000 mg daily, and nitro as needed -No recent chest pain -Cardiac catheterization site on right wrist  healing well  HLD -LDL at goal, 71, HDL 36, total cholesterol 137, triglycerides 174 -Discussed diet modifications -We will plan to repeat lipid panel at next appointment -Statin stopped due to AST/ALT  HTN -Well-controlled today in the clinic -Continue current medication regimen  Lifestyle modification -Discussed alternating regular gum in between his Nicorette -Discussed weaning off of [facility] and drinking more water throughout the day -Discussed making better diet choices with less fast food and less red meat       Disposition: Follow up 3-4 months with Sherren Mocha, MD or APP.  Signed, Elgie Collard, PA-C 07/21/2022, 4:19 PM [facility]

## 2022-08-08 ENCOUNTER — Ambulatory Visit: Payer: Managed Care, Other (non HMO) | Attending: Interventional Cardiology | Admitting: Pharmacist

## 2022-08-08 DIAGNOSIS — E7800 Pure hypercholesterolemia, unspecified: Secondary | ICD-10-CM

## 2022-08-08 MED ORDER — REPATHA SURECLICK 140 MG/ML ~~LOC~~ SOAJ: 140.0000 mg | SUBCUTANEOUS | 11 refills | Status: DC

## 2022-08-08 NOTE — Patient Instructions (Addendum)
-  start repatha 140 mg q14 days to reach LDL-C goal <55 -Continue to limit red meat in diet and increase physical actvity (150 minutes/week) -Continue to wean off nicotine gum with sugar free gum

## 2022-08-08 NOTE — Progress Notes (Addendum)
Patient ID: [name]                 DOB: Sep 15, 1961                    MRN: [id number]      HPI: [name] is a 61 y.o. male patient referred to lipid clinic by Dr. Burt Knack. PMH is significant for coronary artery disease status post prior inferior MI treated with stenting to the RCA (2005/2006), HLD, HTN. Patient has been previously trialed on a rosuvastatin 20 mg but stopped due to increase in his AST/ALT (62/103) which increased from (41/64) respectively.   Current Lipid Medications: None  Intolerances: rosuvastatin 20 mg (elevated LFTs) Risk Factors: CAD, previous smoker, HTN LDL goal: <55 (pre-mature ASCVD)   Diet:  -limiting red meat, grab a burger if doesn't cook a meal  Exercise:  -Has increased walking in his routine  Family History:  Family History  Problem Relation Age of Onset   Heart attack Mother    Heart disease Mother    Aneurysm Father    Cancer Maternal Uncle        lung    Social History: quit smoking 6 years ago but has been chewing a lot of Nicorette gum (120 pieces q 2 weeks)  Lipid Panel (while on rosuvastatin)    Component Value Date/Time   CHOL 137 06/19/2022 0846   TRIG 174 (H) 06/19/2022 0846   HDL 36 (L) 06/19/2022 0846   CHOLHDL 3.8 06/19/2022 0846   CHOLHDL 3.1 07/23/2015 0734   VLDL 11 07/23/2015 0734   LDLCALC 71 06/19/2022 0846   LDLDIRECT 141.1 09/10/2009 1037   LABVLDL 30 06/19/2022 0846     Past Medical History:  Diagnosis Date   Arthritis    Coronary artery disease    History of heart attack    seen by Dr Lia Foyer   Hyperlipidemia    Hypertension    Myocardial infarction [facility]) 2005-2006?   stent 07    Current Outpatient Medications on File Prior to Visit  Medication Sig Dispense Refill   albuterol (VENTOLIN HFA) 108 (90 Base) MCG/ACT inhaler Inhale 2 puffs into the lungs every 6 (six) hours as needed for wheezing (Congestion).     aspirin 81 MG tablet Take 81 mg by mouth daily.      metoprolol succinate  (TOPROL-XL) 50 MG 24 hr tablet TAKE 1 TABLET BY MOUTH EVERY DAY 90 tablet 3   Multiple Vitamin (MULTIVITAMIN) tablet Take 1 tablet by mouth daily.     nitroGLYCERIN (NITROSTAT) 0.4 MG SL tablet Place 1 tablet (0.4 mg total) under the tongue every 5 (five) minutes as needed. DISSOLVE 1 TAB UNDER TONGUE EVERY 5 MINUTES AS NEEDED FOR CHEST PAIN MAY REPEAT 3 TIMES 25 tablet 2   Omega-3 Fatty Acids (FISH OIL) 1000 MG CAPS Take 3 capsules by mouth daily.      No current facility-administered medications on file prior to visit.    Allergies  Allergen Reactions   Percocet [Oxycodone-Acetaminophen] Nausea And Vomiting   Oxycodone-Acetaminophen Nausea And Vomiting    Assessment/Plan:  HYPERCHOLESTEROLEMIA Assessment: Trying to limit intake of red meat Has started walking as part of his routine Stopped rosuvastatin and was not on any lipid lowering medications  LDL-C at 71 with goal being <55  Plan: Start Repatha '150mg'$  every 14 days  Monitor for injection site reactions, rotate injection site with each dose, make sure to inject into subcutaneous tissue Patient's current plan  covers repatha; was given copay card   Thank you,  Sandford Craze, PharmD. [facility] PGY-1 08/08/2022 11:58 AM   Ramond Dial, Pharm.D, BCPS, CPP [place]  [address], [place], [place] [address]  Phone: [phone]; Fax: [phone]

## 2022-08-08 NOTE — Assessment & Plan Note (Addendum)
Assessment: Trying to limit intake of red meat Has started walking as part of his routine Stopped rosuvastatin and was not on any lipid lowering medications  LDL-C at 71 with goal being <55  Plan: Start Repatha '150mg'$  every 14 days  Monitor for injection site reactions, rotate injection site with each dose, make sure to inject into subcutaneous tissue Patient's current plan covers repatha; was given copay card

## 2022-08-11 ENCOUNTER — Telehealth: Payer: Self-pay | Admitting: Pharmacist

## 2022-08-11 NOTE — Telephone Encounter (Signed)
Called patient to remind him Repatha is ready at [facility] and no charge since covered by his insurance. Told him to call back if he has any questions about what we discussed in clinic  Sandford Craze, PharmD. [facility] PGY-1  08/11/2022 4:00 PM

## 2022-10-20 ENCOUNTER — Encounter: Payer: Self-pay | Admitting: Pharmacist

## 2022-11-26 ENCOUNTER — Ambulatory Visit: Payer: Managed Care, Other (non HMO) | Attending: Cardiovascular Disease | Admitting: Cardiovascular Disease

## 2022-11-26 ENCOUNTER — Encounter: Payer: Self-pay | Admitting: Cardiovascular Disease

## 2022-11-26 VITALS — BP 140/86 | HR 65 | Ht 67.0 in | Wt 185.2 lb

## 2022-11-26 DIAGNOSIS — I10 Essential (primary) hypertension: Secondary | ICD-10-CM

## 2022-11-26 DIAGNOSIS — R7989 Other specified abnormal findings of blood chemistry: Secondary | ICD-10-CM

## 2022-11-26 DIAGNOSIS — I2510 Atherosclerotic heart disease of native coronary artery without angina pectoris: Secondary | ICD-10-CM

## 2022-11-26 DIAGNOSIS — E785 Hyperlipidemia, unspecified: Secondary | ICD-10-CM

## 2022-11-26 LAB — LIPID PANEL

## 2022-11-26 MED ORDER — NITROGLYCERIN 0.4 MG SL SUBL: SUBLINGUAL_TABLET | SUBLINGUAL | 3 refills | Status: DC

## 2022-11-26 NOTE — Progress Notes (Signed)
Cardiology Office Note:    Date:  11/26/2022   ID:  [name], DOB Mar 28, 1961, MRN [id number]  PCP:  Antony Contras, MD   [facility] Cardiologist:  Sherren Mocha, MD     Referring MD: Antony Contras, MD   Chief Complaint  Patient presents with   Coronary Artery Disease    History of Present Illness:    [name] is a 62 y.o. male with a hx of coronary artery disease, presenting for follow-up evaluation.  The patient has a history of inferior MI treated with RCA stenting in 2000 and again in 2006.  He also has mixed hyperlipidemia previously treated with a statin drug, transition to [place] in the setting of elevated LFTs.  Last heart catheterization in 2023 demonstrated patency of the left coronary artery and mild to moderate in-stent restenosis in the right coronary artery evaluated with pressure wire analysis found to be negative for ischemia.  The patient is here alone today.  He is doing well at present.  He denies chest pain, chest pressure, or shortness of breath.  No lightheadedness or heart palpitations.  Reports compliance with his medications.  He is using nicotine gum but has not smoked in over 8 years now.  Past Medical History:  Diagnosis Date   Arthritis    Coronary artery disease    History of heart attack    seen by Dr Lia Foyer   Hyperlipidemia    Hypertension    Myocardial infarction [facility]) 2005-2006?   stent 07    Past Surgical History:  Procedure Laterality Date   bicep surgery      right bicep    CARDIAC CATHETERIZATION  2008,07   ELBOW SURGERY     left elbow    INTRAVASCULAR PRESSURE WIRE/FFR STUDY N/A 07/09/2022   Procedure: INTRAVASCULAR PRESSURE WIRE/FFR STUDY;  Surgeon: Sherren Mocha, MD;  Location: [place] [facility];  Service: Cardiovascular;  Laterality: N/A;   LEFT HEART CATH AND CORONARY ANGIOGRAPHY N/A 07/09/2022   Procedure: LEFT HEART CATH AND CORONARY ANGIOGRAPHY;  Surgeon: Sherren Mocha, MD;  Location: [place] [facility];  Service: Cardiovascular;  Laterality: N/A;   LEFT HEART CATHETERIZATION WITH CORONARY ANGIOGRAM N/A 03/29/2014   Procedure: LEFT HEART CATHETERIZATION WITH CORONARY ANGIOGRAM;  Surgeon: Blane Ohara, MD;  Location: [facility] CATH LAB;  Service: Cardiovascular;  Laterality: N/A;   MASS EXCISION  09/11/2011   Procedure: EXCISION MASS;  Surgeon: Gayland Curry, MD;  Location: [place];  Service: General;  Laterality: Left;  excision of left groin subcutaenous mass   NECK SURGERY  2009   SHOULDER ARTHROSCOPY     WRIST ARTHROSCOPY WITH DEBRIDEMENT  09/27/2012   Procedure: WRIST ARTHROSCOPY WITH DEBRIDEMENT;  Surgeon: Wynonia Sours, MD;  Location: [place];  Service: Orthopedics;  Laterality: Left;  LEFT WRIST ARTHROSCOPY DEBRIDEMENT  SHRINKAGE TRIANGULAR FIBROCARTILAGE COMPLEX/ LIGAMENT     Current Medications: Current Meds  Medication Sig   aspirin 81 MG tablet Take 81 mg by mouth daily.    Evolocumab (REPATHA SURECLICK) [id number] MG/ML SOAJ Inject 140 mg into the skin every 14 (fourteen) days.   metoprolol succinate (TOPROL-XL) 50 MG 24 hr tablet TAKE 1 TABLET BY MOUTH EVERY DAY   Multiple Vitamin (MULTIVITAMIN) tablet Take 1 tablet by mouth daily.   Omega-3 Fatty Acids (FISH OIL) 1000 MG CAPS Take 3 capsules by mouth daily.    [DISCONTINUED] albuterol (VENTOLIN HFA) 108 (90 Base) MCG/ACT inhaler Inhale 2  puffs into the lungs every 6 (six) hours as needed for wheezing (Congestion).   [DISCONTINUED] nitroGLYCERIN (NITROSTAT) 0.4 MG SL tablet Place 1 tablet (0.4 mg total) under the tongue every 5 (five) minutes as needed. DISSOLVE 1 TAB UNDER TONGUE EVERY 5 MINUTES AS NEEDED FOR CHEST PAIN MAY REPEAT 3 TIMES     Allergies:   Percocet [oxycodone-acetaminophen] and Oxycodone-acetaminophen   Social History   Socioeconomic History   Marital status: Married    Spouse name: Not on file   Number of children: Not on file   Years of education: Not on file    Highest education level: Not on file  Occupational History   Not on file  Tobacco Use   Smoking status: Former    Packs/day: 1.00    Types: Cigarettes    Quit date: 10/31/2015    Years since quitting: 7.0   Smokeless tobacco: Never  Vaping Use   Vaping Use: Never used  Substance and Sexual Activity   Alcohol use: No   Drug use: No   Sexual activity: Not on file  Other Topics Concern   Not on file  Social History Narrative   Not on file   Social Determinants of Health   Financial Resource Strain: Not on file  Food Insecurity: Not on file  Transportation Needs: Not on file  Physical Activity: Not on file  Stress: Not on file  Social Connections: Not on file     Family History: The patient's family history includes Aneurysm in his father; Cancer in his maternal uncle; Heart attack in his mother; Heart disease in his mother.  ROS:   Please see the history of present illness.    All other systems reviewed and are negative.  EKGs/Labs/Other Studies Reviewed:    The following studies were reviewed today: Cardiac Cath 07-09-2022: 1.  Single-vessel coronary artery disease with mild to moderate diffuse restenosis in the RCA stent up to 50% with negative RFR evaluation 2.  Widely patent left main, LAD, and left circumflex with mild nonobstructive plaquing noted 3.  Mild hypokinesis of the inferior wall with overall preserved LVEF estimated at 50 to 55% with normal LVEDP   Recommendations: Continued medical therapy  EKG:  EKG is not ordered today.    Recent Labs: 06/19/2022: ALT 103 07/03/2022: BUN 15; Creatinine, Ser 0.97; Hemoglobin 14.7; Platelets 174; Potassium 5.3; Sodium 140  Recent Lipid Panel    Component Value Date/Time   CHOL 137 06/19/2022 0846   TRIG 174 (H) 06/19/2022 0846   HDL 36 (L) 06/19/2022 0846   CHOLHDL 3.8 06/19/2022 0846   CHOLHDL 3.1 07/23/2015 0734   VLDL 11 07/23/2015 0734   LDLCALC 71 06/19/2022 0846   LDLDIRECT 141.1 09/10/2009 1037      Risk Assessment/Calculations:      HYPERTENSION CONTROL Vitals:   11/26/22 0936 11/26/22 1004  BP: (!) 140/100 (!) 140/86    The patient's blood pressure is elevated above target today.  In order to address the patient's elevated BP: Blood pressure will be monitored at home to determine if medication changes need to be made.            Physical Exam:    VS:  BP (!) 140/86   Pulse 65   Ht '5\' 7"'$  (1.702 m)   Wt 185 lb 3.2 oz (84 kg)   SpO2 96%   BMI 29.01 kg/m     Wt Readings from Last 3 Encounters:  11/26/22 185 lb 3.2 oz (84 kg)  07/21/22 186 lb (84.4 kg)  07/09/22 183 lb (83 kg)     GEN:  Well nourished, well developed in no acute distress HEENT: Normal NECK: No JVD; No carotid bruits LYMPHATICS: No lymphadenopathy CARDIAC: RRR, no murmurs, rubs, gallops RESPIRATORY:  Clear to auscultation without rales, wheezing or rhonchi  ABDOMEN: Soft, non-tender, non-distended MUSCULOSKELETAL:  No edema; No deformity  SKIN: Warm and dry NEUROLOGIC:  Alert and oriented x 3 PSYCHIATRIC:  Normal affect   ASSESSMENT:    1. Hyperlipidemia LDL goal <70   2. Coronary artery disease involving native coronary artery of native heart without angina pectoris   3. Essential hypertension   4. LFTs abnormal    PLAN:    In order of problems listed above:  Patient off of statin drugs due to elevated LFTs.  Now treated with Repatha.  Needs updated lipids and LFTs will check today. I reviewed his cardiac catheterization films with him.  He has no obstructive disease throughout the left coronary artery distribution.  His RCA stent was patent with mild to moderate diffuse in-stent restenosis and a negative RFR evaluation.  He has no anginal symptoms and he will continue on aspirin, Repatha, and metoprolol succinate. Blood pressure is moderately elevated.  On my check today his blood pressure is 140/86 mmHg.  He has some anxiety about being here in the office and is really concerned  about his liver function.  I suspect this is raising his blood pressure.  I reviewed proper technique for checking blood pressure at home.  I asked him to check his blood pressure 3 days/week and to notify us in 2 weeks with his results.  If his blood pressure is elevated, I would be reasonable to start him on an ARB or calcium channel blocker like amlodipine. Discussed at length with him today.  Repeat LFTs today.  If his LFTs have normalized off rosuvastatin he will not require any further evaluation.  If his transaminases remain elevated, will consider a liver ultrasound and/or referral to GI for further evaluation depending on degree of elevation.           Medication Adjustments/Labs and Tests Ordered: Current medicines are reviewed at length with the patient today.  Concerns regarding medicines are outlined above.  Orders Placed This Encounter  Procedures   CBC   Comprehensive metabolic panel   Lipid panel   Meds ordered this encounter  Medications   nitroGLYCERIN (NITROSTAT) 0.4 MG SL tablet    Sig: Dissolve 1 tablet under the tongue every 5 minutes as needed for chest pain. Max of 3 doses, then 911.    Dispense:  25 tablet    Refill:  3    Patient Instructions  Medication Instructions:  Your physician recommends that you continue on your current medications as directed. Please refer to the Current Medication list given to you today.  *If you need a refill on your cardiac medications before your next appointment, please call your pharmacy*   Lab Work: CBC, CMET, Lipids today If you have labs (blood work) drawn today and your tests are completely normal, you will receive your results only by: [place] (if you have MyChart) OR A paper copy in the mail If you have any lab test that is abnormal or we need to change your treatment, we will call you to review the results.   Testing/Procedures: **Please monitor blood pressure at home 2-3 times per week and call/MyChart  message with log (readings)**   Follow-Up: At [facility], you and your health needs are our priority.  As part of our continuing mission to provide you with exceptional heart care, we have created designated Provider Care Teams.  These Care Teams include your primary Cardiologist (physician) and Advanced Practice Providers (APPs -  Physician Assistants and Nurse Practitioners) who all work together to provide you with the care you need, when you need it.  We recommend signing up for the patient portal called "MyChart".  Sign up information is provided on this After Visit Summary.  MyChart is used to connect with patients for Virtual Visits (Telemedicine).  Patients are able to view lab/test results, encounter notes, upcoming appointments, etc.  Non-urgent messages can be sent to your provider as well.   To learn more about what you can do with MyChart, go to [email or web address].    Your next appointment:   1 year(s)  Provider:   Sherren Mocha, MD        Signed, Sherren Mocha, MD  11/26/2022 2:51 PM    [place]

## 2022-11-26 NOTE — Patient Instructions (Signed)
Medication Instructions:  Your physician recommends that you continue on your current medications as directed. Please refer to the Current Medication list given to you today.  *If you need a refill on your cardiac medications before your next appointment, please call your pharmacy*   Lab Work: CBC, CMET, Lipids today If you have labs (blood work) drawn today and your tests are completely normal, you will receive your results only by: [place] (if you have MyChart) OR A paper copy in the mail If you have any lab test that is abnormal or we need to change your treatment, we will call you to review the results.   Testing/Procedures: **Please monitor blood pressure at home 2-3 times per week and call/MyChart message with log (readings)**   Follow-Up: At [facility], you and your health needs are our priority.  As part of our continuing mission to provide you with exceptional heart care, we have created designated Provider Care Teams.  These Care Teams include your primary Cardiologist (physician) and Advanced Practice Providers (APPs -  Physician Assistants and Nurse Practitioners) who all work together to provide you with the care you need, when you need it.  We recommend signing up for the patient portal called "MyChart".  Sign up information is provided on this After Visit Summary.  MyChart is used to connect with patients for Virtual Visits (Telemedicine).  Patients are able to view lab/test results, encounter notes, upcoming appointments, etc.  Non-urgent messages can be sent to your provider as well.   To learn more about what you can do with MyChart, go to [email or web address].    Your next appointment:   1 year(s)  Provider:   Sherren Mocha, MD

## 2022-11-27 LAB — COMPREHENSIVE METABOLIC PANEL
ALT: 80 IU/L — ABNORMAL HIGH (ref 0–44)
AST: 53 IU/L — ABNORMAL HIGH (ref 0–40)
Albumin/Globulin Ratio: 2.5 — ABNORMAL HIGH (ref 1.2–2.2)
Albumin: 4.9 g/dL (ref 3.9–4.9)
Alkaline Phosphatase: 82 IU/L (ref 44–121)
BUN/Creatinine Ratio: 15 (ref 10–24)
BUN: 15 mg/dL (ref 8–27)
Bilirubin Total: 0.9 mg/dL (ref 0.0–1.2)
CO2: 25 mmol/L (ref 20–29)
Calcium: 9.6 mg/dL (ref 8.6–10.2)
Chloride: 101 mmol/L (ref 96–106)
Creatinine, Ser: 1.03 mg/dL (ref 0.76–1.27)
Globulin, Total: 2 g/dL (ref 1.5–4.5)
Glucose: 161 mg/dL — ABNORMAL HIGH (ref 70–99)
Potassium: 4.5 mmol/L (ref 3.5–5.2)
Sodium: 141 mmol/L (ref 134–144)
Total Protein: 6.9 g/dL (ref 6.0–8.5)
eGFR: 83 mL/min/{1.73_m2} (ref >59)

## 2022-11-27 LAB — LIPID PANEL
Chol/HDL Ratio: 4 ratio (ref 0.0–5.0)
Cholesterol, Total: 148 mg/dL (ref 100–199)
HDL: 37 mg/dL — ABNORMAL LOW (ref >39)
LDL Chol Calc (NIH): 65 mg/dL (ref 0–99)
Triglycerides: 290 mg/dL — ABNORMAL HIGH (ref 0–149)
VLDL Cholesterol Cal: 46 mg/dL — ABNORMAL HIGH (ref 5–40)

## 2022-11-27 LAB — CBC
Hematocrit: 47.8 % (ref 37.5–51.0)
Hemoglobin: 16 g/dL (ref 13.0–17.7)
MCH: 30.7 pg (ref 26.6–33.0)
MCHC: 33.5 g/dL (ref 31.5–35.7)
MCV: 92 fL (ref 79–97)
Platelets: 199 10*3/uL (ref 150–450)
RBC: 5.21 x10E6/uL (ref 4.14–5.80)
RDW: 13.3 % (ref 11.6–15.4)
WBC: 7.2 10*3/uL (ref 3.4–10.8)

## 2022-12-09 ENCOUNTER — Telehealth: Payer: Self-pay | Admitting: Cardiovascular Disease

## 2022-12-09 DIAGNOSIS — R748 Abnormal levels of other serum enzymes: Secondary | ICD-10-CM

## 2022-12-09 NOTE — Telephone Encounter (Signed)
-----   Message from Sherren Mocha, MD sent at 12/09/2022  2:33 PM EDT ----- Mildly elevated LFT's with AST just above reference range and ALT 2xULN. No longer on a statin drug. Please order a liver US for further evaluation. thanks

## 2022-12-09 NOTE — Telephone Encounter (Signed)
Called and spoke directly with wife and patient about findings. Order placed for ultrasound and they understand they will be contacted for scheduling.

## 2022-12-12 ENCOUNTER — Telehealth: Payer: Self-pay | Admitting: Cardiovascular Disease

## 2022-12-12 NOTE — Telephone Encounter (Signed)
-----   Message from [place], [place] K sent at 11/26/2022 10:10 AM EST ----- Regarding: BP log Seen pt in clinic w/[facility] on 2/28-asked to check BP 2-3x/wk and send in log in 2 weeks (12/12/22)

## 2022-12-12 NOTE — Telephone Encounter (Signed)
Reached out to patient for BP log since being seen here on 2/28-I havent received anything from him yet. Mr [name] states he hasn't been checking his pressure "as much as I should and havent been writing it down." He states he will look up his readings and send via [place].

## 2022-12-27 ENCOUNTER — Encounter: Payer: Self-pay | Admitting: Pharmacist

## 2022-12-30 NOTE — Telephone Encounter (Signed)
Thx [name]. [name] I reviewed his BP's and they are running above goal. Can you ask him to start amlodipine 5 mg daily, continue to record BP about 3 days/week, and have him send [place] readings in 3-4 weeks?  thx

## 2023-01-02 MED ORDER — AMLODIPINE BESYLATE 5 MG PO TABS: 5.0000 mg | ORAL_TABLET | Freq: Every day | ORAL | 3 refills | Status: DC

## 2023-01-05 ENCOUNTER — Ambulatory Visit
Admission: RE | Admit: 2023-01-05 | Discharge: 2023-01-05 | Disposition: A | Discharge status: Home / Self Care | Payer: Managed Care, Other (non HMO) | Source: Ambulatory Visit | Attending: Cardiovascular Disease | Admitting: Cardiovascular Disease

## 2023-01-05 DIAGNOSIS — R748 Abnormal levels of other serum enzymes: Secondary | ICD-10-CM

## 2023-01-09 ENCOUNTER — Encounter: Payer: Self-pay | Admitting: Pharmacist

## 2023-01-09 ENCOUNTER — Telehealth: Payer: Self-pay | Admitting: Cardiovascular Disease

## 2023-01-09 DIAGNOSIS — K760 Fatty (change of) liver, not elsewhere classified: Secondary | ICD-10-CM

## 2023-01-09 DIAGNOSIS — R7989 Other specified abnormal findings of blood chemistry: Secondary | ICD-10-CM

## 2023-01-09 NOTE — Telephone Encounter (Signed)
-----   Message from Tonny Bollman, MD sent at 01/07/2023  3:34 PM EDT ----- Pt with fatty liver disease. His LFT's have been mildly elevated and he has been changed from a statin drug to a PCSK9 inhibitor with lipids at goal. Suspect only observation and lifestyle modification is indicated, but please refer to GI for outpatient consultation. thanks

## 2023-01-09 NOTE — Telephone Encounter (Signed)
Spoke with patient via MyChart about GI referral. Order placed at this time.

## 2023-01-12 ENCOUNTER — Encounter: Payer: Self-pay | Admitting: Gastroenterology

## 2023-01-13 ENCOUNTER — Encounter: Payer: Self-pay | Admitting: Gastroenterology

## 2023-03-11 ENCOUNTER — Other Ambulatory Visit: Payer: Managed Care, Other (non HMO)

## 2023-03-11 ENCOUNTER — Encounter: Payer: Self-pay | Admitting: Internal Medicine

## 2023-03-11 ENCOUNTER — Telehealth: Payer: Self-pay | Admitting: Gastroenterology

## 2023-03-11 ENCOUNTER — Ambulatory Visit: Payer: Managed Care, Other (non HMO) | Admitting: Gastroenterology

## 2023-03-11 ENCOUNTER — Encounter: Payer: Self-pay | Admitting: Gastroenterology

## 2023-03-11 VITALS — BP 130/80 | HR 70 | Ht 67.0 in | Wt 184.8 lb

## 2023-03-11 DIAGNOSIS — Z1211 Encounter for screening for malignant neoplasm of colon: Secondary | ICD-10-CM

## 2023-03-11 DIAGNOSIS — K760 Fatty (change of) liver, not elsewhere classified: Secondary | ICD-10-CM

## 2023-03-11 DIAGNOSIS — R7989 Other specified abnormal findings of blood chemistry: Secondary | ICD-10-CM

## 2023-03-11 LAB — CBC WITH DIFFERENTIAL/PLATELET
Basophils Absolute: 0 10*3/uL (ref 0.0–0.1)
Basophils Relative: 0.6 % (ref 0.0–3.0)
Eosinophils Absolute: 0.1 10*3/uL (ref 0.0–0.7)
Eosinophils Relative: 1.3 % (ref 0.0–5.0)
HCT: 44.7 % (ref 39.0–52.0)
Hemoglobin: 15.1 g/dL (ref 13.0–17.0)
Lymphocytes Relative: 28.7 % (ref 12.0–46.0)
Lymphs Abs: 1.8 10*3/uL (ref 0.7–4.0)
MCHC: 33.7 g/dL (ref 30.0–36.0)
MCV: 91.2 fl (ref 78.0–100.0)
Monocytes Absolute: 0.7 10*3/uL (ref 0.1–1.0)
Monocytes Relative: 10.7 % (ref 3.0–12.0)
Neutro Abs: 3.7 10*3/uL (ref 1.4–7.7)
Neutrophils Relative %: 58.7 % (ref 43.0–77.0)
Platelets: 202 10*3/uL (ref 150.0–400.0)
RBC: 4.9 Mil/uL (ref 4.22–5.81)
RDW: 13.6 % (ref 11.5–15.5)
WBC: 6.3 10*3/uL (ref 4.0–10.5)

## 2023-03-11 LAB — IBC + FERRITIN
Ferritin: 201.6 ng/mL (ref 22.0–322.0)
Iron: 126 ug/dL (ref 42–165)
Saturation Ratios: 32 % (ref 20.0–50.0)
TIBC: 393.4 ug/dL (ref 250.0–450.0)
Transferrin: 281 mg/dL (ref 212.0–360.0)

## 2023-03-11 LAB — COMPREHENSIVE METABOLIC PANEL
ALT: 70 U/L — ABNORMAL HIGH (ref 0–53)
AST: 41 U/L — ABNORMAL HIGH (ref 0–37)
Albumin: 4.8 g/dL (ref 3.5–5.2)
Alkaline Phosphatase: 66 U/L (ref 39–117)
BUN: 15 mg/dL (ref 6–23)
CO2: 28 mEq/L (ref 19–32)
Calcium: 9.6 mg/dL (ref 8.4–10.5)
Chloride: 103 mEq/L (ref 96–112)
Creatinine, Ser: 0.99 mg/dL (ref 0.40–1.50)
GFR: 82.08 mL/min (ref >60.00)
Glucose, Bld: 144 mg/dL — ABNORMAL HIGH (ref 70–99)
Potassium: 4 mEq/L (ref 3.5–5.1)
Sodium: 139 mEq/L (ref 135–145)
Total Bilirubin: 0.9 mg/dL (ref 0.2–1.2)
Total Protein: 7.6 g/dL (ref 6.0–8.3)

## 2023-03-11 MED ORDER — NA SULFATE-K SULFATE-MG SULF 17.5-3.13-1.6 GM/177ML PO SOLN: 1.0000 | Freq: Once | ORAL | 0 refills | Status: AC

## 2023-03-11 NOTE — Patient Instructions (Signed)
Your provider has requested that you go to the basement level for lab work before leaving today. Press "B" on the elevator. The lab is located at the first door on the left as you exit the elevator.   Your provider has ordered Cologuard testing as an option for colon cancer screening. This is performed by [employer] and may be out of network with your insurance. PRIOR to completing the test, it is YOUR responsibility to contact your insurance about covered benefits for this test. Your out of pocket expense could be anywhere from $0.00 to $649.00.   When you call to check coverage with your insurer, please provide the following information:   -The ONLY provider of Cologuard is Optician, dispensing  - CPT code for Cologuard is [id number].  [employer] NPI # [id number]  -[employer] Tax ID # [id number]   We have already sent your demographic and insurance information to [employer] (phone number [phone]) and they should contact you within the next week regarding your test. If you have not heard from them within the next week, please call our office at [phone].    _______________________________________________________  If your blood pressure at your visit was 140/90 or greater, please contact your primary care physician to follow up on this.  _______________________________________________________  If you are age 47 or older, your body mass index should be between 23-30. Your Body mass index is 28.94 kg/m. If this is out of the aforementioned range listed, please consider follow up with your Primary Care Provider.  If you are age 18 or younger, your body mass index should be between 19-25. Your Body mass index is 28.94 kg/m. If this is out of the aformentioned range listed, please consider follow up with your Primary Care Provider.   ________________________________________________________  The [facility] providers would like to  encourage you to use [facility] to communicate with providers for non-urgent requests or questions.  Due to long hold times on the telephone, sending your provider a message by [facility] may be a faster and more efficient way to get a response.  Please allow 48 business hours for a response.  Please remember that this is for non-urgent requests.  _______________________________________________________

## 2023-03-11 NOTE — Telephone Encounter (Signed)
Patient's wife called and said patient cardiologist requested patient have a colonoscopy not a cologuard. Patient's wife asked patient if he was okay with scheduling a colonoscopy. Patient stated yes. Scheduled colonoscopy for 04/27/23 @ 11:30 am with Dr. Chales Abrahams.

## 2023-03-11 NOTE — Telephone Encounter (Signed)
Pt seen in office today will send to [address] [facility]

## 2023-03-11 NOTE — Telephone Encounter (Signed)
Patient's wife is calling states the patient was under the impression he only needed to do the cologaurd but he actually needs to be scheduled for a colonoscopy, is this okay? Please advise

## 2023-03-11 NOTE — Addendum Note (Signed)
Addended by: Mariane Duval on: 03/11/2023 03:59 PM   Modules accepted: Orders

## 2023-03-11 NOTE — Progress Notes (Signed)
03/11/2023 AMOUS CREWE [id number] 1961-06-29   HISTORY OF PRESENT ILLNESS: This is a 62 year old male who is new to our office.  He has been referred here by Dr. Excell Seltzer for evaluation of abnormal LFTs and fatty liver.  He has also never had a colonoscopy or colon cancer screening.  AST and ALT have been elevated dating back to July 2022.  ALT peak 103 and AST peak 62.  Alk phos and total bili are normal.  Albumin is normal.  Platelet count is normal.  Ultrasound showed moderate hepatic steatosis with possible early morphologic changes of cirrhosis.  No other sequelae of portal hypertension noted and he had patent hepatic vasculature.  He has never had colon cancer screening.  He denies any GI complaints.  Has a lot of healthcare related anxiety.  Referred here on this occasion by Dr. Excell Seltzer.  Past Medical History:  Diagnosis Date   Arthritis    Coronary artery disease    History of heart attack    seen by Dr Riley Kill   Hyperlipidemia    Hypertension    Myocardial infarction [facility]) 2005-2006?   stent 07   Past Surgical History:  Procedure Laterality Date   bicep surgery      right bicep    CARDIAC CATHETERIZATION  2008,07   CORONARY PRESSURE/FFR STUDY N/A 07/09/2022   Procedure: INTRAVASCULAR PRESSURE WIRE/FFR STUDY;  Surgeon: Tonny Bollman, MD;  Location: [facility] INVASIVE CV LAB;  Service: Cardiovascular;  Laterality: N/A;   ELBOW SURGERY     left elbow    LEFT HEART CATH AND CORONARY ANGIOGRAPHY N/A 07/09/2022   Procedure: LEFT HEART CATH AND CORONARY ANGIOGRAPHY;  Surgeon: Tonny Bollman, MD;  Location: [facility] INVASIVE CV LAB;  Service: Cardiovascular;  Laterality: N/A;   LEFT HEART CATHETERIZATION WITH CORONARY ANGIOGRAM N/A 03/29/2014   Procedure: LEFT HEART CATHETERIZATION WITH CORONARY ANGIOGRAM;  Surgeon: Micheline Chapman, MD;  Location: [facility] CATH LAB;  Service: Cardiovascular;  Laterality: N/A;   MASS EXCISION  09/11/2011   Procedure: EXCISION MASS;  Surgeon: Atilano Ina, MD;   Location: [facility];  Service: General;  Laterality: Left;  excision of left groin subcutaenous mass   NECK SURGERY  2009   SHOULDER ARTHROSCOPY     WRIST ARTHROSCOPY WITH DEBRIDEMENT  09/27/2012   Procedure: WRIST ARTHROSCOPY WITH DEBRIDEMENT;  Surgeon: Nicki Reaper, MD;  Location: [facility];  Service: Orthopedics;  Laterality: Left;  LEFT WRIST ARTHROSCOPY DEBRIDEMENT  SHRINKAGE TRIANGULAR FIBROCARTILAGE COMPLEX/ LIGAMENT     reports that he quit smoking about 7 years ago. His smoking use included cigarettes. He smoked an average of 1 pack per day. He has never used smokeless tobacco. He reports that he does not drink alcohol and does not use drugs. family history includes Aneurysm in his father; Cancer in his maternal uncle; Heart attack in his mother; Heart disease in his mother. Allergies  Allergen Reactions   Percocet [Oxycodone-Acetaminophen] Nausea And Vomiting   Oxycodone-Acetaminophen Nausea And Vomiting      Outpatient Encounter Medications as of 03/11/2023  Medication Sig   amLODipine (NORVASC) 5 MG tablet Take 1 tablet (5 mg total) by mouth daily.   aspirin 81 MG tablet Take 81 mg by mouth daily.    Evolocumab (REPATHA SURECLICK) 140 MG/ML SOAJ Inject 140 mg into the skin every 14 (fourteen) days.   metoprolol succinate (TOPROL-XL) 50 MG 24 hr tablet TAKE 1 TABLET BY MOUTH EVERY DAY   Multiple Vitamin (MULTIVITAMIN)  tablet Take 1 tablet by mouth daily.   nitroGLYCERIN (NITROSTAT) 0.4 MG SL tablet Dissolve 1 tablet under the tongue every 5 minutes as needed for chest pain. Max of 3 doses, then 911.   Omega-3 Fatty Acids (FISH OIL) 1000 MG CAPS Take 3 capsules by mouth daily.    No facility-administered encounter medications on file as of 03/11/2023.    REVIEW OF SYSTEMS  : All other systems reviewed and negative except where noted in the History of Present Illness.   PHYSICAL EXAM: BP 130/80   Pulse 70   Ht 5\' 7"  (1.702 m)   Wt 184 lb 12.8  oz (83.8 kg)   BMI 28.94 kg/m  General: Well developed white male in no acute distress Head: Normocephalic and atraumatic Eyes:  Sclerae anicteric, conjunctiva pink. Ears: Normal auditory acuity Lungs: Clear throughout to auscultation; no W/R/R. Heart: Regular rate and rhythm; no M/R/G. Musculoskeletal: Symmetrical with no gross deformities  Skin: No lesions on visible extremities Extremities: No edema  Neurological: Alert oriented x 4, grossly non-focal Psychological:  Alert and cooperative. Normal mood and affect  ASSESSMENT AND PLAN: *Elevated LFTs: AST and ALT have been elevated somewhat since July 2022.  Last checked February 2024.  Ultrasound shows hepatic steatosis and possibly early cirrhosis.  Platelets are normal.  Elevated LFTs likely due to hepatic steatosis, but will perform other serologies to rule out other causes of chronic liver disease.  Will check a PT/INR, repeat CMP and CBC today as well. *CRC screening:  Patient wants to do Cologuard first.  Will order that.  CC:  Tally Joe, MD CC: Dr. Excell Seltzer

## 2023-03-12 LAB — HEPATITIS C ANTIBODY: Hepatitis C Ab: NONREACTIVE

## 2023-03-17 LAB — HEPATITIS B SURFACE ANTIGEN: Hepatitis B Surface Ag: NONREACTIVE

## 2023-03-17 LAB — MITOCHONDRIAL ANTIBODIES: Mitochondrial M2 Ab, IgG: <=20 U (ref <=20.0)

## 2023-03-17 LAB — ANTI-SMOOTH MUSCLE ANTIBODY, IGG: Actin (Smooth Muscle) Antibody (IGG): <20 U (ref <20)

## 2023-03-17 LAB — ANA: Anti Nuclear Antibody (ANA): NEGATIVE

## 2023-03-17 LAB — HEPATITIS A ANTIBODY, TOTAL: Hepatitis A AB,Total: NONREACTIVE

## 2023-03-17 LAB — IGA: Immunoglobulin A: 146 mg/dL (ref 70–320)

## 2023-03-17 LAB — HEPATITIS B SURFACE ANTIBODY,QUALITATIVE: Hep B S Ab: NONREACTIVE

## 2023-03-17 LAB — IGG: IgG (Immunoglobin G), Serum: 846 mg/dL (ref 600–1540)

## 2023-03-19 ENCOUNTER — Encounter: Payer: Managed Care, Other (non HMO) | Admitting: Gastroenterology

## 2023-03-26 ENCOUNTER — Encounter: Payer: Self-pay | Admitting: Gastroenterology

## 2023-04-23 NOTE — Progress Notes (Signed)
Agree with assessment/plan.  Raj Gupta, MD [facility] [phone]  

## 2023-04-27 ENCOUNTER — Ambulatory Visit: Payer: Managed Care, Other (non HMO) | Admitting: Gastroenterology

## 2023-04-27 ENCOUNTER — Encounter: Payer: Self-pay | Admitting: Gastroenterology

## 2023-04-27 VITALS — BP 102/61 | HR 62 | Temp 98.4°F | Resp 16 | Ht 67.0 in | Wt 184.0 lb

## 2023-04-27 DIAGNOSIS — Z1211 Encounter for screening for malignant neoplasm of colon: Secondary | ICD-10-CM

## 2023-04-27 DIAGNOSIS — K760 Fatty (change of) liver, not elsewhere classified: Secondary | ICD-10-CM

## 2023-04-27 DIAGNOSIS — D124 Benign neoplasm of descending colon: Secondary | ICD-10-CM

## 2023-04-27 MED ORDER — SODIUM CHLORIDE 0.9 % IV SOLN: 500.0000 mL | Freq: Once | INTRAVENOUS | Status: DC

## 2023-04-27 NOTE — Progress Notes (Signed)
Pt's states no medical or surgical changes since previsit or office visit. 

## 2023-04-27 NOTE — Progress Notes (Signed)
Called to room to assist during endoscopic procedure.  Patient ID and intended procedure confirmed with present staff. Received instructions for my participation in the procedure from the performing physician.  

## 2023-04-27 NOTE — Progress Notes (Signed)
Report to PACU, RN, vss, BBS= Clear.  

## 2023-04-27 NOTE — Patient Instructions (Addendum)
Recommendation:           - Patient has a contact number available for                            emergencies. The signs and symptoms of potential                            delayed complications were discussed with the                            patient. Return to normal activities tomorrow.                            Written discharge instructions were provided to the                            patient.                           - High fiber diet.                           - Use Benefiber one teaspoon PO daily with 8 ounces                            of water                           - Await pathology results.                           - Repeat colonoscopy for surveillance based on                            pathology results.                           - The findings and recommendations were discussed                            with the patient's family.  Handouts on high fiber diet, diverticulosis and polyps given.  YOU HAD AN ENDOSCOPIC PROCEDURE TODAY AT THE [facility]:   Refer to the procedure report that was given to you for any specific questions about what was found during the examination.  If the procedure report does not answer your questions, please call your gastroenterologist to clarify.  If you requested that your care partner not be given the details of your procedure findings, then the procedure report has been included in a sealed envelope for you to review at your convenience later.  YOU SHOULD EXPECT: Some feelings of bloating in the abdomen. Passage of more gas than usual.  Walking can help get rid of the air that was put into your GI tract during the procedure and reduce the bloating. If you had a lower endoscopy (such as a colonoscopy or flexible sigmoidoscopy) you may notice spotting of blood in your stool or on the toilet paper. If you underwent a  bowel prep for your procedure, you may not have a normal bowel movement for a few days.  Please Note:  You  might notice some irritation and congestion in your nose or some drainage.  This is from the oxygen used during your procedure.  There is no need for concern and it should clear up in a day or so.  SYMPTOMS TO REPORT IMMEDIATELY:  Following lower endoscopy (colonoscopy or flexible sigmoidoscopy):  Excessive amounts of blood in the stool  Significant tenderness or worsening of abdominal pains  Swelling of the abdomen that is new, acute  Fever of 100F or higher   For urgent or emergent issues, a gastroenterologist can be reached at any hour by calling [phone]. Do not use MyChart messaging for urgent concerns.    DIET:  We do recommend a small meal at first, but then you may proceed to your regular diet.  Drink plenty of fluids but you should avoid alcoholic beverages for 24 hours.  ACTIVITY:  You should plan to take it easy for the rest of today and you should NOT DRIVE or use heavy machinery until tomorrow (because of the sedation medicines used during the test).    FOLLOW UP: Our staff will call the number listed on your records the next business day following your procedure.  We will call around 7:15- 8:00 am to check on you and address any questions or concerns that you may have regarding the information given to you following your procedure. If we do not reach you, we will leave a message.     If any biopsies were taken you will be contacted by phone or by letter within the next 1-3 weeks.  Please call us at [phone] if you have not heard about the biopsies in 3 weeks.    SIGNATURES/CONFIDENTIALITY: You and/or your care partner have signed paperwork which will be entered into your electronic medical record.  These signatures attest to the fact that that the information above on your After Visit Summary has been reviewed and is understood.  Full responsibility of the confidentiality of this discharge information lies with you and/or your care-partner.

## 2023-04-27 NOTE — Progress Notes (Signed)
[facility] History and Physical   Primary Care Physician:  Tally Joe, MD   Reason for Procedure:   CRC screening  Plan:    colon     HPI: [name] is a 62 y.o. male    Past Medical History:  Diagnosis Date   Arthritis    Coronary artery disease    History of heart attack    seen by Dr Riley Kill   Hyperlipidemia    Hypertension    Myocardial infarction [facility]) 2005-2006?   stent 07    Past Surgical History:  Procedure Laterality Date   bicep surgery      right bicep    CARDIAC CATHETERIZATION  2008,07   CORONARY PRESSURE/FFR STUDY N/A 07/09/2022   Procedure: INTRAVASCULAR PRESSURE WIRE/FFR STUDY;  Surgeon: Tonny Bollman, MD;  Location: [facility] INVASIVE CV LAB;  Service: Cardiovascular;  Laterality: N/A;   ELBOW SURGERY     left elbow    LEFT HEART CATH AND CORONARY ANGIOGRAPHY N/A 07/09/2022   Procedure: LEFT HEART CATH AND CORONARY ANGIOGRAPHY;  Surgeon: Tonny Bollman, MD;  Location: [facility] INVASIVE CV LAB;  Service: Cardiovascular;  Laterality: N/A;   LEFT HEART CATHETERIZATION WITH CORONARY ANGIOGRAM N/A 03/29/2014   Procedure: LEFT HEART CATHETERIZATION WITH CORONARY ANGIOGRAM;  Surgeon: Micheline Chapman, MD;  Location: [facility] CATH LAB;  Service: Cardiovascular;  Laterality: N/A;   MASS EXCISION  09/11/2011   Procedure: EXCISION MASS;  Surgeon: Atilano Ina, MD;  Location: [facility];  Service: General;  Laterality: Left;  excision of left groin subcutaenous mass   NECK SURGERY  2009   SHOULDER ARTHROSCOPY     WRIST ARTHROSCOPY WITH DEBRIDEMENT  09/27/2012   Procedure: WRIST ARTHROSCOPY WITH DEBRIDEMENT;  Surgeon: Nicki Reaper, MD;  Location: [facility];  Service: Orthopedics;  Laterality: Left;  LEFT WRIST ARTHROSCOPY DEBRIDEMENT  SHRINKAGE TRIANGULAR FIBROCARTILAGE COMPLEX/ LIGAMENT     Prior to Admission medications   Medication Sig Start Date End Date Taking? Authorizing Provider  amLODipine (NORVASC) 5 MG tablet Take 1  tablet (5 mg total) by mouth daily. 01/02/23  Yes Tonny Bollman, MD  aspirin 81 MG tablet Take 81 mg by mouth daily.    Yes [provider]  metoprolol succinate (TOPROL-XL) 50 MG 24 hr tablet TAKE 1 TABLET BY MOUTH EVERY DAY 07/03/22  Yes Tonny Bollman, MD  Multiple Vitamin (MULTIVITAMIN) tablet Take 1 tablet by mouth daily.   Yes [provider]  Omega-3 Fatty Acids (FISH OIL) 1000 MG CAPS Take 3 capsules by mouth daily.    Yes [provider]  rosuvastatin (CRESTOR) 20 MG tablet 1 tablet Orally Once a day   Yes [provider]  Evolocumab (REPATHA SURECLICK) 140 MG/ML SOAJ Inject 140 mg into the skin every 14 (fourteen) days. 08/08/22   Tonny Bollman, MD  nitroGLYCERIN (NITROSTAT) 0.4 MG SL tablet Dissolve 1 tablet under the tongue every 5 minutes as needed for chest pain. Max of 3 doses, then 911. 11/26/22   Tonny Bollman, MD    Current Outpatient Medications  Medication Sig Dispense Refill   amLODipine (NORVASC) 5 MG tablet Take 1 tablet (5 mg total) by mouth daily. 90 tablet 3   aspirin 81 MG tablet Take 81 mg by mouth daily.      metoprolol succinate (TOPROL-XL) 50 MG 24 hr tablet TAKE 1 TABLET BY MOUTH EVERY DAY 90 tablet 3   Multiple Vitamin (MULTIVITAMIN) tablet Take 1 tablet by mouth daily.  Omega-3 Fatty Acids (FISH OIL) 1000 MG CAPS Take 3 capsules by mouth daily.      rosuvastatin (CRESTOR) 20 MG tablet 1 tablet Orally Once a day     Evolocumab (REPATHA SURECLICK) 140 MG/ML SOAJ Inject 140 mg into the skin every 14 (fourteen) days. 2 mL 11   nitroGLYCERIN (NITROSTAT) 0.4 MG SL tablet Dissolve 1 tablet under the tongue every 5 minutes as needed for chest pain. Max of 3 doses, then 911. 25 tablet 3   Current Facility-Administered Medications  Medication Dose Route Frequency Provider Last Rate Last Admin   0.9 %  sodium chloride infusion  500 mL Intravenous Once Lynann Bologna, MD        Allergies as of 04/27/2023 - Review Complete  04/27/2023  Allergen Reaction Noted   Percocet [oxycodone-acetaminophen] Nausea And Vomiting 09/17/2011   Oxycodone-acetaminophen Nausea And Vomiting 02/20/2014    Family History  Problem Relation Age of Onset   Heart attack Mother    Heart disease Mother    Aneurysm Father    Cancer Maternal Uncle        lung   Colon cancer Neg Hx    Rectal cancer Neg Hx    Stomach cancer Neg Hx     Social History   Socioeconomic History   Marital status: Married    Spouse name: Not on file   Number of children: Not on file   Years of education: Not on file   Highest education level: Not on file  Occupational History   Not on file  Tobacco Use   Smoking status: Former    Current packs/day: 0.00    Types: Cigarettes    Quit date: 10/31/2015    Years since quitting: 7.4   Smokeless tobacco: Never  Vaping Use   Vaping status: Never Used  Substance and Sexual Activity   Alcohol use: No   Drug use: No   Sexual activity: Not on file  Other Topics Concern   Not on file  Social History Narrative   Not on file   Social Determinants of Health   Financial Resource Strain: Not on file  Food Insecurity: Not on file  Transportation Needs: Not on file  Physical Activity: Not on file  Stress: Not on file  Social Connections: Not on file  Intimate Partner Violence: Not on file    Review of Systems: Positive for none All other review of systems negative except as mentioned in the HPI.  Physical Exam: Vital signs in last 24 hours: @VSRANGES @   General:   Alert,  Well-developed, well-nourished, pleasant and cooperative in NAD Lungs:  Clear throughout to auscultation.   Heart:  Regular rate and rhythm; no murmurs, clicks, rubs,  or gallops. Abdomen:  Soft, nontender and nondistended. Normal bowel sounds.   Neuro/Psych:  Alert and cooperative. Normal mood and affect. A and O x 3    No significant changes were identified.  The patient continues to be an appropriate candidate for the  planned procedure and anesthesia.   Edman Circle, MD. [facility] 04/27/2023 11:20 AM@

## 2023-04-27 NOTE — Op Note (Signed)
[facility] Patient Name: [name] Procedure Date: 04/27/2023 11:14 AM MRN: [id number] Endoscopist: Lynann Bologna , MD, [id number] Age: 62 Referring MD:  Date of Birth: [birth year] Gender: Male Account #: [id number] Procedure:                Colonoscopy Indications:              Screening for colorectal malignant neoplasm Medicines:                Monitored Anesthesia Care Procedure:                Pre-Anesthesia Assessment:                           - Prior to the procedure, a History and Physical                            was performed, and patient medications and                            allergies were reviewed. The patient's tolerance of                            previous anesthesia was also reviewed. The risks                            and benefits of the procedure and the sedation                            options and risks were discussed with the patient.                            All questions were answered, and informed consent                            was obtained. Prior Anticoagulants: The patient has                            taken no anticoagulant or antiplatelet agents. ASA                            Grade Assessment: II - A patient with mild systemic                            disease. After reviewing the risks and benefits,                            the patient was deemed in satisfactory condition to                            undergo the procedure.                           After obtaining informed consent, the colonoscope  was passed under direct vision. Throughout the                            procedure, the patient's blood pressure, pulse, and                            oxygen saturations were monitored continuously. The                            CF [id number] #[id number] was introduced through the anus                            and advanced to the the cecum, identified by                            appendiceal orifice  and ileocecal valve. The                            colonoscopy was performed without difficulty. The                            patient tolerated the procedure well. The quality                            of the bowel preparation was good. The ileocecal                            valve, appendiceal orifice, and rectum were                            photographed. Scope In: 11:25:42 AM Scope Out: 11:44:24 AM Scope Withdrawal Time: 0 hours 12 minutes 25 seconds  Total Procedure Duration: 0 hours 18 minutes 42 seconds  Findings:                 A 4 mm polyp was found in the mid descending colon.                            The polyp was sessile. The polyp was removed with a                            cold biopsy forceps. Resection and retrieval were                            complete.                           Multiple medium-mouthed diverticula were found in                            the sigmoid colon, few in descending colon and                            ascending colon.  Non-bleeding internal hemorrhoids were found during                            retroflexion. The hemorrhoids were small and Grade                            I (internal hemorrhoids that do not prolapse).                           The exam was otherwise without abnormality on                            direct and retroflexion views. Complications:            No immediate complications. Estimated Blood Loss:     Estimated blood loss: none. Impression:               - One 4 mm polyp in the mid descending colon,                            removed with a cold biopsy forceps. Resected and                            retrieved.                           - Moderate predominantly sigmoid diverticulosis                           - Non-bleeding internal hemorrhoids.                           - The examination was otherwise normal on direct                            and retroflexion  views. Recommendation:           - Patient has a contact number available for                            emergencies. The signs and symptoms of potential                            delayed complications were discussed with the                            patient. Return to normal activities tomorrow.                            Written discharge instructions were provided to the                            patient.                           - High fiber diet.                           -  Use Benefiber one teaspoon PO daily with 8 ounces                            of water                           - Await pathology results.                           - Repeat colonoscopy for surveillance based on                            pathology results.                           - The findings and recommendations were discussed                            with the patient's family. Lynann Bologna, MD 04/27/2023 11:48:17 AM This report has been signed electronically.

## 2023-04-28 ENCOUNTER — Telehealth: Payer: Self-pay

## 2023-04-28 NOTE — Telephone Encounter (Signed)
  Follow up Call-     04/27/2023   10:52 AM  Call back number  Post procedure Call Back phone  # [phone]  Permission to leave phone message Yes     Patient questions:  Do you have a fever, pain , or abdominal swelling? No. Pain Score  0 *  Have you tolerated food without any problems? Yes.    Have you been able to return to your normal activities? Yes.    Do you have any questions about your discharge instructions: Diet   No. Medications  No. Follow up visit  No.  Do you have questions or concerns about your Care? No.  Actions: * If pain score is 4 or above: No action needed, pain <4.

## 2023-04-30 ENCOUNTER — Encounter: Payer: Self-pay | Admitting: Gastroenterology

## 2023-06-28 ENCOUNTER — Other Ambulatory Visit: Payer: Self-pay | Admitting: Cardiovascular Disease

## 2023-07-26 ENCOUNTER — Other Ambulatory Visit: Payer: Self-pay | Admitting: Cardiovascular Disease

## 2023-10-01 ENCOUNTER — Encounter: Payer: Self-pay | Admitting: Cardiovascular Disease

## 2023-12-26 ENCOUNTER — Other Ambulatory Visit: Payer: Self-pay | Admitting: Cardiovascular Disease

## 2024-01-26 ENCOUNTER — Other Ambulatory Visit: Payer: Self-pay | Admitting: Cardiovascular Disease

## 2024-01-28 ENCOUNTER — Other Ambulatory Visit: Payer: Self-pay | Admitting: Cardiovascular Disease

## 2024-02-16 ENCOUNTER — Telehealth: Payer: Self-pay | Admitting: Cardiovascular Disease

## 2024-02-16 MED ORDER — AMLODIPINE BESYLATE 5 MG PO TABS: 5.0000 mg | ORAL_TABLET | Freq: Every day | ORAL | 1 refills | Status: DC

## 2024-02-16 MED ORDER — METOPROLOL SUCCINATE ER 50 MG PO TB24: 50.0000 mg | ORAL_TABLET | Freq: Every day | ORAL | 1 refills | Status: DC

## 2024-02-16 NOTE — Telephone Encounter (Signed)
*  STAT* If patient is at the pharmacy, call can be transferred to refill team.   1. Which medications need to be refilled? (please list name of each medication and dose if known) amLODipine  (NORVASC ) 5 MG tablet   metoprolol  succinate (TOPROL -XL) 50 MG 24 hr tablet    2. Which pharmacy/location (including street and city if local pharmacy) is medication to be sent to?  [id number] - [place], [place] - [address]      3. Do they need a 30 day or 90 day supply? 90 day    Pt has office visit scheduled and has been placed on waitlist

## 2024-02-16 NOTE — Telephone Encounter (Signed)
 Pt's medications were sent to pt's pharmacy as requested. Confirmation received.

## 2024-03-31 NOTE — Progress Notes (Deleted)
 Office Visit    Patient Name: [name] Date of Encounter: 03/31/2024  PCP:  Seabron Lenis, MD   [facility]  Cardiologist:  Ozell Fell, MD  Advanced Practice Provider:  No care team member to display Electrophysiologist:  None   HPI    [name] is a 63 y.o. male with a history of coronary artery disease status post prior inferior MI treated with stenting to the RCA (2000 01/2005), hyperlipidemia presents today for postcardiac catheterization follow-up.  Last cardiac catheterization in 2015 demonstrated patent RCA stent.  Echo 01/2018 with LVEF of 50 to 55% and grade 2 DD.  He stopped smoking about 6 years ago but continues to chew Nicorette gum (120 pieces every 2 weeks).  He was seen by Dr. Fell 06/13/2022 and his EKG showed ST/T wave inversion in the inferior lead and lead V3 through V6.  Chronic T wave changes in inferior lead but new V2 to V6.  Mostly asymptomatic but had chest pain episode few months prior.  Beta-blocker increased due to elevated blood pressure.  He was then seen again by Dr. Fell 07/03/2022 and had spread back of mulch in the yard and got very tired afterwards and lay down most of the day.  He was compliant with his medications.  No syncope or dizziness.  Due to his symptoms and EKG a cardiac catheterization was ordered.   He was seen by me fall of  2023, he is doing well.  No further chest pain, shortness of breath, but still does endorse some fatigue after doing a lot of activity.  He remains compliant on his medications.  We discussed a few lifestyle changes.  He quit smoking 6 years ago but has been chewing a lot of Nicorette gum.  He also admits to eating a lot of fast food especially burgers.  His triglycerides remain elevated above goal at 174.  We also discussed incorporating an exercise program into his routine for further cardiac risk reduction.  He has extreme itchiness on the outside of his ears.  This kept him up all  night.  I said he could try some over-the-counter cortisone cream and recommended he follow-up with his primary.  Reports no chest pain, pressure, or tightness. No edema, orthopnea, PND. Reports no palpitations.   He was seen by Dr. Fell February 2024  and was doing well at that time.  No chest pain or shortness of breath.  No lightheadedness or heart palpitations.  Compliance with medications.  Using nicotine gum but had not smoked in over 8 years.  Today, he***   Past Medical History    Past Medical History:  Diagnosis Date   Arthritis    Coronary artery disease    History of heart attack    seen by Dr Morris   Hyperlipidemia    Hypertension    Myocardial infarction [facility]) 2005-2006?   stent 07   Past Surgical History:  Procedure Laterality Date   bicep surgery      right bicep    CARDIAC CATHETERIZATION  2008,07   CORONARY PRESSURE/FFR STUDY N/A 07/09/2022   Procedure: INTRAVASCULAR PRESSURE WIRE/FFR STUDY;  Surgeon: Fell Ozell, MD;  Location: [facility] INVASIVE CV LAB;  Service: Cardiovascular;  Laterality: N/A;   ELBOW SURGERY     left elbow    LEFT HEART CATH AND CORONARY ANGIOGRAPHY N/A 07/09/2022   Procedure: LEFT HEART CATH AND CORONARY ANGIOGRAPHY;  Surgeon: Fell Ozell, MD;  Location: [facility] INVASIVE CV  LAB;  Service: Cardiovascular;  Laterality: N/A;   LEFT HEART CATHETERIZATION WITH CORONARY ANGIOGRAM N/A 03/29/2014   Procedure: LEFT HEART CATHETERIZATION WITH CORONARY ANGIOGRAM;  Surgeon: Ozell JONETTA Fell, MD;  Location: [facility] CATH LAB;  Service: Cardiovascular;  Laterality: N/A;   MASS EXCISION  09/11/2011   Procedure: EXCISION MASS;  Surgeon: Camellia CHRISTELLA Blush, MD;  Location: [facility];  Service: General;  Laterality: Left;  excision of left groin subcutaenous mass   NECK SURGERY  2009   SHOULDER ARTHROSCOPY     WRIST ARTHROSCOPY WITH DEBRIDEMENT  09/27/2012   Procedure: WRIST ARTHROSCOPY WITH DEBRIDEMENT;  Surgeon: Arley JONELLE Curia, MD;  Location: [facility]  ;  Service: Orthopedics;  Laterality: Left;  LEFT WRIST ARTHROSCOPY DEBRIDEMENT  SHRINKAGE TRIANGULAR FIBROCARTILAGE COMPLEX/ LIGAMENT     Allergies  Allergies  Allergen Reactions   Percocet [Oxycodone -Acetaminophen ] Nausea And Vomiting   Oxycodone -Acetaminophen  Nausea And Vomiting     EKGs/Labs/Other Studies Reviewed:   The following studies were reviewed today:  Cardiac catheterization 07/09/2022  Left Anterior Descending  There is mild diffuse disease throughout the vessel. The LAD reaches the apex of the heart. The vessel has mild irregularity but no significant stenosis throughout. The vessel wraps the LV apex. The diagonal branches are small in caliber with no significant stenosis. The septal perforating branches have no significant stenoses.    Left Circumflex  The vessel exhibits minimal luminal irregularities.  Prox Cx to Mid Cx lesion is 40% stenosed.    First Obtuse Marginal Branch  The circumflex has minimal irregularity. The vessel supplies a single obtuse marginal branch with no significant stenosis. There is a mild 30 to 40% stenosis in the midportion of the circumflex.    Right Coronary Artery  There is mild diffuse disease throughout the vessel.  Prox RCA lesion is 50% stenosed. The lesion was previously treated over 2 years ago.    Intervention   No interventions have been documented.   Wall Motion              Left Heart  Left Ventricle The left ventricular size is normal. The left ventricular systolic function is normal. LV end diastolic pressure is normal. The left ventricular ejection fraction is 50-55% by visual estimate. There are LV function abnormalities due to segmental dysfunction.   Coronary Diagrams   Diagnostic Dominance: Right    Intervention    EKG:  EKG is not ordered today.  Recent Labs: No results found for requested labs within last 365 days.  Recent Lipid Panel    Component Value Date/Time    CHOL 148 11/26/2022 1011   TRIG 290 (H) 11/26/2022 1011   HDL 37 (L) 11/26/2022 1011   CHOLHDL 4.0 11/26/2022 1011   CHOLHDL 3.1 07/23/2015 0734   VLDL 11 07/23/2015 0734   LDLCALC 65 11/26/2022 1011   LDLDIRECT 141.1 09/10/2009 1037    Home Medications   No outpatient medications have been marked as taking for the 04/11/24 encounter (Appointment) with Lucien Orren SAILOR, PA-C.     Review of Systems      All other systems reviewed and are otherwise negative except as noted above.  Physical Exam    VS:  There were no vitals taken for this visit. , BMI There is no height or weight on file to calculate BMI.  Wt Readings from Last 3 Encounters:  04/27/23 184 lb (83.5 kg)  03/11/23 184 lb 12.8 oz (83.8 kg)  11/26/22 185 lb 3.2 oz (84  kg)     GEN: Well nourished, well developed, in no acute distress. HEENT: normal. Neck: Supple, no JVD, carotid bruits, or masses. Cardiac: RRR, no murmurs, rubs, or gallops. No clubbing, cyanosis, edema.  Radials/PT 2+ and equal bilaterally.  Respiratory:  Respirations regular and unlabored, clear to auscultation bilaterally. GI: Soft, nontender, nondistended. MS: No deformity or atrophy. Skin: Warm and dry, no rash. Neuro:  Strength and sensation are intact. Psych: Normal affect.  Assessment & Plan    CAD -Recent cardiac catheterization with 50% in-stent RCA stenosis, 40% circumflex stenosis -Continue GDMT: Aspirin  81 mg daily, metoprolol  succinate 50 mg daily, omega-3 fatty acids 3000 mg daily, and nitro as needed -No recent chest pain -Cardiac catheterization site on right wrist healing well  HLD -LDL at goal, 71, HDL 36, total cholesterol 862, triglycerides 174 -Discussed diet modifications -We will plan to repeat lipid panel at next appointment -Statin stopped due to AST/ALT  HTN -Well-controlled today in the clinic -Continue current medication regimen  Lifestyle modification -Discussed alternating regular gum in between his  Nicorette -Discussed weaning off of [facility] and drinking more water throughout the day -Discussed making better diet choices with less fast food and less red meat  No BP recorded.  {Refresh Note OR Click here to enter BP  :1}***    Disposition: Follow up 3-4 months with Ozell Fell, MD or APP.  Signed, Orren LOISE Fabry, PA-C 03/31/2024, 4:32 PM [facility]

## 2024-04-11 ENCOUNTER — Ambulatory Visit: Attending: Physician Assistant | Admitting: Physician Assistant

## 2024-04-11 DIAGNOSIS — I2510 Atherosclerotic heart disease of native coronary artery without angina pectoris: Secondary | ICD-10-CM

## 2024-04-11 DIAGNOSIS — I10 Essential (primary) hypertension: Secondary | ICD-10-CM

## 2024-04-11 DIAGNOSIS — E785 Hyperlipidemia, unspecified: Secondary | ICD-10-CM

## 2024-04-20 ENCOUNTER — Other Ambulatory Visit: Payer: Self-pay | Admitting: Cardiovascular Disease

## 2024-04-24 NOTE — Progress Notes (Unsigned)
 Office Visit    Patient Name: [name] Date of Encounter: 04/27/2024  PCP:  Seabron Lenis, MD   [facility]  Cardiologist:  Ozell Fell, MD  Advanced Practice Provider:  No care team member to display Electrophysiologist:  None   HPI    [name] is a 63 y.o. male with a history of coronary artery disease status post prior inferior MI treated with stenting to the RCA (2005/2006), hyperlipidemia presents today for postcardiac catheterization follow-up.  Last cardiac catheterization in 2015 demonstrated patent RCA stent.  Echo 01/2018 with LVEF of 50 to 55% and grade 2 DD.  He stopped smoking about 6 years ago but continues to chew Nicorette gum (120 pieces every 2 weeks).  He was seen by Dr. Fell 06/13/2022 and his EKG showed ST/T wave inversion in the inferior lead and lead V3 through V6.  Chronic T wave changes in inferior lead but new V2 to V6.  Mostly asymptomatic but had chest pain episode few months prior.  Beta-blocker increased due to elevated blood pressure.  He was then seen again by Dr. Fell 07/03/2022 and had spread back of mulch in the yard and got very tired afterwards and lay down most of the day.  He was compliant with his medications.  No syncope or dizziness.  Due to his symptoms and EKG a cardiac catheterization was ordered.   He was seen by me fall of  2023, he is doing well.  No further chest pain, shortness of breath, but still does endorse some fatigue after doing a lot of activity.  He remains compliant on his medications.  We discussed a few lifestyle changes.  He quit smoking 6 years ago but has been chewing a lot of Nicorette gum.  He also admits to eating a lot of fast food especially burgers.  His triglycerides remain elevated above goal at 174.  We also discussed incorporating an exercise program into his routine for further cardiac risk reduction.  He has extreme itchiness on the outside of his ears.  This kept him up all  night.  I said he could try some over-the-counter cortisone cream and recommended he follow-up with his primary.  Reports no chest pain, pressure, or tightness. No edema, orthopnea, PND. Reports no palpitations.   He was seen by Dr. Fell February 2024  and was doing well at that time.  No chest pain or shortness of breath.  No lightheadedness or heart palpitations.  Compliance with medications.  Using nicotine gum but had not smoked in over 8 years.  Today, he presents with coronary artery disease who presents for cardiovascular follow-up.  He has no new chest pain or shortness of breath since his last visit. He experienced a myocardial infarction with stenting to the RCA.   He is currently off statins due to liver concerns and is taking Zetia for cholesterol management. He quit smoking and uses Nicorette gum, 2 mg, which has improved his respiratory health. He does not consume alcohol and uses Tylenol  sparingly for headaches. We spent a lot of the visit discussing lifestyle changes.   Reports no shortness of breath nor dyspnea on exertion. Reports no chest pain, pressure, or tightness. No edema, orthopnea, PND. Reports no palpitations.   Discussed the use of AI scribe software for clinical note transcription with the patient, who gave verbal consent to proceed.   Past Medical History    Past Medical History:  Diagnosis Date   Arthritis  Coronary artery disease    History of heart attack    seen by Dr Morris   Hyperlipidemia    Hypertension    Myocardial infarction [facility]) 2005-2006?   stent 07   Past Surgical History:  Procedure Laterality Date   bicep surgery      right bicep    CARDIAC CATHETERIZATION  2008,07   CORONARY PRESSURE/FFR STUDY N/A 07/09/2022   Procedure: INTRAVASCULAR PRESSURE WIRE/FFR STUDY;  Surgeon: Wonda Sharper, MD;  Location: [facility] INVASIVE CV LAB;  Service: Cardiovascular;  Laterality: N/A;   ELBOW SURGERY     left elbow    LEFT HEART CATH AND CORONARY  ANGIOGRAPHY N/A 07/09/2022   Procedure: LEFT HEART CATH AND CORONARY ANGIOGRAPHY;  Surgeon: Wonda Sharper, MD;  Location: [facility] INVASIVE CV LAB;  Service: Cardiovascular;  Laterality: N/A;   LEFT HEART CATHETERIZATION WITH CORONARY ANGIOGRAM N/A 03/29/2014   Procedure: LEFT HEART CATHETERIZATION WITH CORONARY ANGIOGRAM;  Surgeon: Sharper JONETTA Wonda, MD;  Location: [facility] CATH LAB;  Service: Cardiovascular;  Laterality: N/A;   MASS EXCISION  09/11/2011   Procedure: EXCISION MASS;  Surgeon: Camellia CHRISTELLA Blush, MD;  Location: [facility];  Service: General;  Laterality: Left;  excision of left groin subcutaenous mass   NECK SURGERY  2009   SHOULDER ARTHROSCOPY     WRIST ARTHROSCOPY WITH DEBRIDEMENT  09/27/2012   Procedure: WRIST ARTHROSCOPY WITH DEBRIDEMENT;  Surgeon: Arley JONELLE Curia, MD;  Location: [facility];  Service: Orthopedics;  Laterality: Left;  LEFT WRIST ARTHROSCOPY DEBRIDEMENT  SHRINKAGE TRIANGULAR FIBROCARTILAGE COMPLEX/ LIGAMENT     Allergies  Allergies  Allergen Reactions   Percocet [Oxycodone -Acetaminophen ] Nausea And Vomiting   Oxycodone -Acetaminophen  Nausea And Vomiting     EKGs/Labs/Other Studies Reviewed:   The following studies were reviewed today:  Cardiac catheterization 07/09/2022  Left Anterior Descending  There is mild diffuse disease throughout the vessel. The LAD reaches the apex of the heart. The vessel has mild irregularity but no significant stenosis throughout. The vessel wraps the LV apex. The diagonal branches are small in caliber with no significant stenosis. The septal perforating branches have no significant stenoses.    Left Circumflex  The vessel exhibits minimal luminal irregularities.  Prox Cx to Mid Cx lesion is 40% stenosed.    First Obtuse Marginal Branch  The circumflex has minimal irregularity. The vessel supplies a single obtuse marginal branch with no significant stenosis. There is a mild 30 to 40% stenosis in the midportion  of the circumflex.    Right Coronary Artery  There is mild diffuse disease throughout the vessel.  Prox RCA lesion is 50% stenosed. The lesion was previously treated over 2 years ago.    Intervention   No interventions have been documented.   Wall Motion              Left Heart  Left Ventricle The left ventricular size is normal. The left ventricular systolic function is normal. LV end diastolic pressure is normal. The left ventricular ejection fraction is 50-55% by visual estimate. There are LV function abnormalities due to segmental dysfunction.   Coronary Diagrams   Diagnostic Dominance: Right    Intervention    EKG:  EKG is not ordered today.  Recent Labs: No results found for requested labs within last 365 days.  Recent Lipid Panel    Component Value Date/Time   CHOL 148 11/26/2022 1011   TRIG 290 (H) 11/26/2022 1011   HDL 37 (L) 11/26/2022 1011   CHOLHDL  4.0 11/26/2022 1011   CHOLHDL 3.1 07/23/2015 0734   VLDL 11 07/23/2015 0734   LDLCALC 65 11/26/2022 1011   LDLDIRECT 141.1 09/10/2009 1037    Home Medications   Current Meds  Medication Sig   amLODipine  (NORVASC ) 5 MG tablet TAKE 1 TABLET (5 MG TOTAL) BY MOUTH DAILY.   aspirin  81 MG tablet Take 81 mg by mouth daily.    Evolocumab  (REPATHA  SURECLICK) 140 MG/ML SOAJ INJECT 140 MG INTO THE SKIN EVERY 14 (FOURTEEN) DAYS.   metoprolol  succinate (TOPROL -XL) 50 MG 24 hr tablet TAKE 1 TABLET BY MOUTH EVERY DAY   Multiple Vitamin (MULTIVITAMIN) tablet Take 1 tablet by mouth daily.   nitroGLYCERIN  (NITROSTAT ) 0.4 MG SL tablet Dissolve 1 tablet under the tongue every 5 minutes as needed for chest pain. Max of 3 doses, then 911.   Omega-3 Fatty Acids (FISH OIL) 1000 MG CAPS Take 3 capsules by mouth daily.      Review of Systems      All other systems reviewed and are otherwise negative except as noted above.  Physical Exam    VS:  BP 124/79 (BP Location: Right Arm, Patient Position: Sitting, Cuff Size:  Normal)   Pulse 67   Resp 16   Ht 5' 7 (1.702 m)   Wt 183 lb 12.8 oz (83.4 kg)   SpO2 96%   BMI 28.79 kg/m  , BMI Body mass index is 28.79 kg/m.  Wt Readings from Last 3 Encounters:  04/27/24 183 lb 12.8 oz (83.4 kg)  04/27/23 184 lb (83.5 kg)  03/11/23 184 lb 12.8 oz (83.8 kg)     GEN: Well nourished, well developed, in no acute distress. HEENT: normal. Neck: Supple, no JVD, carotid bruits, or masses. Cardiac: RRR, no murmurs, rubs, or gallops. No clubbing, cyanosis, edema.  Radials/PT 2+ and equal bilaterally.  Respiratory:  Respirations regular and unlabored, clear to auscultation bilaterally. GI: Soft, nontender, nondistended. MS: No deformity or atrophy. Skin: Warm and dry, no rash. Neuro:  Strength and sensation are intact. Psych: Normal affect.  Assessment & Plan     Coronary artery disease, status post myocardial infarction Status post myocardial infarction  successfully treated. No new chest pain or dyspnea. -continue current medication regimen: Aspirin  81 mg daily, metoprolol  succinate 50 mg daily, omega-3 fatty acids 3000 mg daily, and nitro as needed  Fatty liver disease with elevated liver enzymes Fatty liver disease with previously elevated liver enzymes. Last hepatic panel outdated. No alcohol consumption. Occasional Tylenol  use, advised to avoid frequent use due to potential liver impact. - Order hepatic panel to assess liver enzyme levels. -not on a statin for this reason  Hyperlipidemia, on statin alternative therapy On statin alternative therapy due to previous liver enzyme elevation. Last lipid panel outdated. Diet suboptimal with frequent red meat and fried foods. - Order lipid panel to assess cholesterol levels. - Advise dietary modifications to reduce red meat and fried food intake, increase consumption of chicken, [place], and fish (non-fried).  Nicotine dependence in remission with Nicorette gum Nicotine dependence in remission, using 2 mg  Nicorette gum exclusively. Reports improved breathing since smoking cessation.  Lifestyle changes Diet includes frequent hamburgers and fried foods. - Advise dietary modifications to reduce red meat and fried food intake, increase consumption of chicken, [place], and fish (non-fried). - Encourage gradual incorporation of healthier dietary habits.  Hypertension, controlled Blood pressure well-controlled at 124/79. No new symptoms.       Disposition: Follow up 1 year with Michael Cooper,  MD or APP.  Signed, Orren LOISE Fabry, PA-C 04/27/2024, 8:56 AM [facility]

## 2024-04-27 ENCOUNTER — Encounter: Payer: Self-pay | Admitting: Physician Assistant

## 2024-04-27 ENCOUNTER — Ambulatory Visit: Attending: Physician Assistant | Admitting: Physician Assistant

## 2024-04-27 VITALS — BP 124/79 | HR 67 | Resp 16 | Ht 67.0 in | Wt 183.8 lb

## 2024-04-27 DIAGNOSIS — I10 Essential (primary) hypertension: Secondary | ICD-10-CM | POA: Diagnosis not present

## 2024-04-27 DIAGNOSIS — I2510 Atherosclerotic heart disease of native coronary artery without angina pectoris: Secondary | ICD-10-CM

## 2024-04-27 DIAGNOSIS — E785 Hyperlipidemia, unspecified: Secondary | ICD-10-CM | POA: Diagnosis not present

## 2024-04-27 DIAGNOSIS — R7989 Other specified abnormal findings of blood chemistry: Secondary | ICD-10-CM

## 2024-04-27 DIAGNOSIS — E7800 Pure hypercholesterolemia, unspecified: Secondary | ICD-10-CM | POA: Diagnosis not present

## 2024-04-27 LAB — COMPREHENSIVE METABOLIC PANEL WITH GFR
ALT: 50 IU/L — ABNORMAL HIGH (ref 0–44)
AST: 31 IU/L (ref 0–40)
Albumin: 4.5 g/dL (ref 3.9–4.9)
Alkaline Phosphatase: 90 IU/L (ref 44–121)
BUN/Creatinine Ratio: 16 (ref 10–24)
BUN: 16 mg/dL (ref 8–27)
Bilirubin Total: 0.6 mg/dL (ref 0.0–1.2)
CO2: 22 mmol/L (ref 20–29)
Calcium: 9.2 mg/dL (ref 8.6–10.2)
Chloride: 103 mmol/L (ref 96–106)
Creatinine, Ser: 1.01 mg/dL (ref 0.76–1.27)
Globulin, Total: 2.4 g/dL (ref 1.5–4.5)
Glucose: 149 mg/dL — ABNORMAL HIGH (ref 70–99)
Potassium: 4.1 mmol/L (ref 3.5–5.2)
Sodium: 140 mmol/L (ref 134–144)
Total Protein: 6.9 g/dL (ref 6.0–8.5)
eGFR: 84 mL/min/1.73 (ref >59)

## 2024-04-27 LAB — LIPID PANEL
Chol/HDL Ratio: 4 ratio (ref 0.0–5.0)
Cholesterol, Total: 131 mg/dL (ref 100–199)
HDL: 33 mg/dL — ABNORMAL LOW (ref >39)
LDL Chol Calc (NIH): 65 mg/dL (ref 0–99)
Triglycerides: 195 mg/dL — ABNORMAL HIGH (ref 0–149)
VLDL Cholesterol Cal: 33 mg/dL (ref 5–40)

## 2024-04-27 MED ORDER — AMLODIPINE BESYLATE 5 MG PO TABS: 5.0000 mg | ORAL_TABLET | Freq: Every day | ORAL | 0 refills | Status: DC

## 2024-04-27 MED ORDER — METOPROLOL SUCCINATE ER 50 MG PO TB24: 50.0000 mg | ORAL_TABLET | Freq: Every day | ORAL | 0 refills | Status: DC

## 2024-04-27 MED ORDER — NITROGLYCERIN 0.4 MG SL SUBL: SUBLINGUAL_TABLET | SUBLINGUAL | 3 refills | Status: AC

## 2024-04-27 NOTE — Patient Instructions (Signed)
 Medication Instructions:  Refills to your pharmacy   Lab Work: Lipid panel and CMP today on the first floor  Follow-Up:  Your next appointment:   1 year(s)  Provider:   Ozell Fell, MD    We recommend signing up for the patient portal called MyChart.  Sign up information is provided on this After Visit Summary.  MyChart is used to connect with patients for Virtual Visits (Telemedicine).  Patients are able to view lab/test results, encounter notes, upcoming appointments, etc.  Non-urgent messages can be sent to your provider as well.   To learn more about what you can do with MyChart, go to [email or web address].

## 2024-04-28 ENCOUNTER — Ambulatory Visit: Payer: Self-pay | Admitting: *Deleted

## 2024-05-02 ENCOUNTER — Telehealth: Payer: Self-pay | Admitting: Pharmacy Technician

## 2024-05-02 ENCOUNTER — Other Ambulatory Visit (HOSPITAL_COMMUNITY): Payer: Self-pay

## 2024-05-02 MED ORDER — ICOSAPENT ETHYL 1 G PO CAPS: 2.0000 g | ORAL_CAPSULE | Freq: Two times a day (BID) | ORAL | 3 refills | Status: DC

## 2024-05-02 NOTE — Telephone Encounter (Signed)
 Pharmacy Patient Advocate Encounter   Received notification from CoverMyMeds that prior authorization for Icosapent  1GM is required/requested.   Insurance verification completed.   The patient is insured through [employer] .   Per test claim: PA required; PA submitted to above mentioned insurance via CoverMyMeds Key/confirmation #/EOC [id number] Status is pending

## 2024-05-02 NOTE — Telephone Encounter (Signed)
 Pharmacy Patient Advocate Encounter  Received notification from [employer] that Prior Authorization for Icosapent  has been APPROVED from 04/02/24 to 05/02/25. Ran test claim, Copay is $0.00- 3 months. This test claim was processed through [facility]- copay amounts may vary at other pharmacies due to pharmacy/plan contracts, or as the patient moves through the different stages of their insurance plan.   PA #/Case ID/Reference #: [id number]

## 2024-05-14 ENCOUNTER — Other Ambulatory Visit: Payer: Self-pay | Admitting: Physician Assistant

## 2024-05-25 ENCOUNTER — Ambulatory Visit: Admitting: Physician Assistant

## 2024-06-14 ENCOUNTER — Other Ambulatory Visit: Payer: Self-pay

## 2024-06-15 MED ORDER — ICOSAPENT ETHYL 1 G PO CAPS: 2.0000 g | ORAL_CAPSULE | Freq: Two times a day (BID) | ORAL | 3 refills | Status: AC

## 2024-06-15 MED ORDER — METOPROLOL SUCCINATE ER 50 MG PO TB24: 50.0000 mg | ORAL_TABLET | Freq: Every day | ORAL | 3 refills | Status: AC

## 2024-06-15 MED ORDER — AMLODIPINE BESYLATE 5 MG PO TABS: 5.0000 mg | ORAL_TABLET | Freq: Every day | ORAL | 3 refills | Status: AC

## 2024-06-27 ENCOUNTER — Encounter: Payer: Self-pay | Admitting: Physician Assistant

## 2024-07-22 ENCOUNTER — Other Ambulatory Visit: Payer: Self-pay | Admitting: Cardiovascular Disease

## 2024-07-22 NOTE — Telephone Encounter (Signed)
 Refill Request.

## 2024-08-20 ENCOUNTER — Other Ambulatory Visit: Payer: Self-pay | Admitting: Physician Assistant

## 2024-08-20 DIAGNOSIS — E785 Hyperlipidemia, unspecified: Secondary | ICD-10-CM

## 2024-08-20 DIAGNOSIS — R7989 Other specified abnormal findings of blood chemistry: Secondary | ICD-10-CM

## 2024-08-20 DIAGNOSIS — I2510 Atherosclerotic heart disease of native coronary artery without angina pectoris: Secondary | ICD-10-CM

## 2024-08-20 DIAGNOSIS — I10 Essential (primary) hypertension: Secondary | ICD-10-CM
